# Patient Record
Sex: Female | Born: 1990 | Race: White | Hispanic: No | Smoking: Never smoker
Health system: Southern US, Community
[De-identification: ages and names within clinical notes are randomized; demographics above are authoritative.]

## PROBLEM LIST (undated history)

## (undated) DIAGNOSIS — R112 Nausea with vomiting, unspecified: Secondary | ICD-10-CM

## (undated) DIAGNOSIS — F419 Anxiety disorder, unspecified: Secondary | ICD-10-CM

## (undated) HISTORY — DX: Nausea with vomiting, unspecified: R11.2

## (undated) HISTORY — PX: OTHER SURGICAL HISTORY: SHX169

---

## 2015-06-09 ENCOUNTER — Ambulatory Visit: Payer: Self-pay | Admitting: Obstetrics and Gynecology

## 2015-06-15 ENCOUNTER — Ambulatory Visit: Payer: Self-pay | Admitting: Obstetrics and Gynecology

## 2015-11-25 ENCOUNTER — Emergency Department
Admission: EM | Admit: 2015-11-25 | Discharge: 2015-11-25 | Disposition: A | Discharge status: Home / Self Care | Payer: BLUE CROSS/BLUE SHIELD | Attending: Emergency Medicine | Admitting: Emergency Medicine

## 2015-11-25 ENCOUNTER — Encounter: Payer: Self-pay | Admitting: Emergency Medicine

## 2015-11-25 DIAGNOSIS — O210 Mild hyperemesis gravidarum: Secondary | ICD-10-CM | POA: Diagnosis not present

## 2015-11-25 DIAGNOSIS — Z3A01 Less than 8 weeks gestation of pregnancy: Secondary | ICD-10-CM | POA: Diagnosis not present

## 2015-11-25 DIAGNOSIS — R8299 Other abnormal findings in urine: Secondary | ICD-10-CM | POA: Insufficient documentation

## 2015-11-25 DIAGNOSIS — O219 Vomiting of pregnancy, unspecified: Secondary | ICD-10-CM | POA: Diagnosis present

## 2015-11-25 LAB — CBC WITH DIFFERENTIAL/PLATELET
BASOS ABS: 0 10*3/uL (ref 0–0.1)
BASOS PCT: 0 %
Eosinophils Absolute: 0 10*3/uL (ref 0–0.7)
Eosinophils Relative: 0 %
HEMATOCRIT: 38.3 % (ref 35.0–47.0)
Hemoglobin: 13.5 g/dL (ref 12.0–16.0)
Lymphocytes Relative: 8 %
Lymphs Abs: 1 10*3/uL (ref 1.0–3.6)
MCH: 31.5 pg (ref 26.0–34.0)
MCHC: 35.2 g/dL (ref 32.0–36.0)
MCV: 89.5 fL (ref 80.0–100.0)
MONO ABS: 0.4 10*3/uL (ref 0.2–0.9)
Monocytes Relative: 4 %
NEUTROS ABS: 11 10*3/uL — AB (ref 1.4–6.5)
NEUTROS PCT: 88 %
Platelets: 207 10*3/uL (ref 150–440)
RBC: 4.28 MIL/uL (ref 3.80–5.20)
RDW: 13.3 % (ref 11.5–14.5)
WBC: 12.4 10*3/uL — ABNORMAL HIGH (ref 3.6–11.0)

## 2015-11-25 LAB — COMPREHENSIVE METABOLIC PANEL
ALBUMIN: 4.4 g/dL (ref 3.5–5.0)
ALT: 14 U/L (ref 14–54)
ANION GAP: 8 (ref 5–15)
AST: 16 U/L (ref 15–41)
Alkaline Phosphatase: 61 U/L (ref 38–126)
BILIRUBIN TOTAL: 0.7 mg/dL (ref 0.3–1.2)
BUN: 7 mg/dL (ref 6–20)
CHLORIDE: 106 mmol/L (ref 101–111)
CO2: 22 mmol/L (ref 22–32)
Calcium: 9.1 mg/dL (ref 8.9–10.3)
Creatinine, Ser: 0.46 mg/dL (ref 0.44–1.00)
Glucose, Bld: 103 mg/dL — ABNORMAL HIGH (ref 65–99)
POTASSIUM: 3.9 mmol/L (ref 3.5–5.1)
Sodium: 136 mmol/L (ref 135–145)
TOTAL PROTEIN: 7.4 g/dL (ref 6.5–8.1)

## 2015-11-25 LAB — URINALYSIS COMPLETE WITH MICROSCOPIC (ARMC ONLY)
BILIRUBIN URINE: NEGATIVE
Glucose, UA: NEGATIVE mg/dL
HGB URINE DIPSTICK: NEGATIVE
LEUKOCYTES UA: NEGATIVE
NITRITE: NEGATIVE
PH: 7 (ref 5.0–8.0)
PROTEIN: NEGATIVE mg/dL
SPECIFIC GRAVITY, URINE: 1.001 — AB (ref 1.005–1.030)

## 2015-11-25 LAB — POCT PREGNANCY, URINE: Preg Test, Ur: POSITIVE — AB

## 2015-11-25 LAB — PREGNANCY, URINE: Preg Test, Ur: POSITIVE — AB

## 2015-11-25 LAB — LIPASE, BLOOD: LIPASE: 22 U/L (ref 11–51)

## 2015-11-25 MED ORDER — LACTATED RINGERS IV BOLUS (SEPSIS)
1000.0000 mL | Freq: Once | INTRAVENOUS | Status: AC
Start: 2015-11-25 — End: 2015-11-25
  Administered 2015-11-25: 1000 mL via INTRAVENOUS

## 2015-11-25 MED ORDER — FOLIC ACID 5 MG/ML IJ SOLN
1.0000 mg | Freq: Once | INTRAMUSCULAR | Status: AC
  Administered 2015-11-25: 1 mg via INTRAVENOUS
  Filled 2015-11-25: qty 0.2

## 2015-11-25 MED ORDER — LACTATED RINGERS IV SOLN
2000.0000 mL | Freq: Once | INTRAVENOUS | Status: AC
  Administered 2015-11-25: 2000 mL via INTRAVENOUS

## 2015-11-25 MED ORDER — ONDANSETRON HCL 4 MG/2ML IJ SOLN
4.0000 mg | Freq: Once | INTRAMUSCULAR | Status: AC
  Administered 2015-11-25: 4 mg via INTRAVENOUS
  Filled 2015-11-25: qty 2

## 2015-11-25 MED ORDER — DIPHENHYDRAMINE HCL 50 MG/ML IJ SOLN
12.5000 mg | Freq: Once | INTRAMUSCULAR | Status: AC
  Administered 2015-11-25: 12.5 mg via INTRAVENOUS
  Filled 2015-11-25: qty 1

## 2015-11-25 MED ORDER — DOXYLAMINE-PYRIDOXINE 10-10 MG PO TBEC: DELAYED_RELEASE_TABLET | ORAL | 1 refills | Status: DC

## 2015-11-25 NOTE — ED Notes (Signed)
Pt given water and applesauce and tolerating well.

## 2015-11-25 NOTE — ED Provider Notes (Signed)
[facility] Emergency Department Provider Note  ____________________________________________   I have reviewed the triage vital signs and the nursing notes.   HISTORY  Chief Complaint Morning Sickness    HPI Mikayla Morales is a 25 y.o. female  who is G2 P0 had a TAB this year because of hyperemesis, had a positive pregnancy test at home, last missed her period was towards the end of July making her just over [redacted] weeks pregnant. Patient's had no vaginal bleeding or cramping or spotting. She states however she has been vomiting off and on during the splinting over the last week has become worse just as it was during her prior pregnancy. She denies any hematemesis melena bright red blood per rectum she's having normal bowel movements he has occasional cramping discomfort in her upper abdomen which resolves after vomiting. She is not having any bilious vomiting. She states she feels dehydrated. She is not yet established care with OB.       History reviewed. No pertinent past medical history.  There are no active problems to display for this patient.   History reviewed. No pertinent surgical history.  Prior to Admission medications   Not on File    Allergies Review of patient's allergies indicates no known allergies.  No family history on file.  Social History Social History  Substance Use Topics  . Smoking status: Never Smoker  . Smokeless tobacco: Never Used  . Alcohol use No    Review of Systems Constitutional: No fever/chills Eyes: No visual changes. ENT: No sore throat. No stiff neck no neck pain Cardiovascular: Denies chest pain. Respiratory: Denies shortness of breath. Gastrointestinal:   + vomiting.  No diarrhea.  No constipation. Genitourinary: Negative for dysuria. Musculoskeletal: Negative lower extremity swelling Skin: Negative for rash. Neurological: Negative for severe headaches, focal weakness or numbness. 10-point ROS  otherwise negative.  ____________________________________________   PHYSICAL EXAM:  VITAL SIGNS: ED Triage Vitals [11/25/15 0835]  Enc Vitals Group     BP 104/62     Pulse Rate 91     Resp 20     Temp 98.2 F (36.8 C)     Temp Source Oral     SpO2 99 %     Weight 102 lb (46.3 kg)     Height 5\' 2"  (1.575 m)     Head Circumference      Peak Flow      Pain Score 5     Pain Loc      Pain Edu?      Excl. in GC?     Constitutional: Alert and oriented. Well appearing and in no acute distress. Eyes: Conjunctivae are normal. PERRL. EOMI. Head: Atraumatic. Nose: No congestion/rhinnorhea. Mouth/Throat: Mucous membranes are moist.  Oropharynx non-erythematous. Neck: No stridor.   Nontender with no meningismus Cardiovascular: Normal rate, regular rhythm. Grossly normal heart sounds.  Good peripheral circulation. Respiratory: Normal respiratory effort.  No retractions. Lungs CTAB. Abdominal: Soft and nontender. No distention. No guarding no rebound Back:  There is no focal tenderness or step off.  there is no midline tenderness there are no lesions noted. there is no CVA tenderness Musculoskeletal: No lower extremity tenderness, no upper extremity tenderness. No joint effusions, no DVT signs strong distal pulses no edema Neurologic:  Normal speech and language. No gross focal neurologic deficits are appreciated.  Skin:  Skin is warm, dry and intact. No rash noted. Psychiatric: Mood and affect are normal. Speech and behavior are normal.  ____________________________________________  LABS (all labs ordered are listed, but only abnormal results are displayed)  Labs Reviewed  CBC WITH DIFFERENTIAL/PLATELET - Abnormal; Notable for the following:       Result Value   WBC 12.4 (*)    Neutro Abs 11.0 (*)    All other components within normal limits  COMPREHENSIVE METABOLIC PANEL - Abnormal; Notable for the following:    Glucose, Bld 103 (*)    All other components within normal  limits  URINALYSIS COMPLETEWITH MICROSCOPIC ([facility] ONLY) - Abnormal; Notable for the following:    Color, Urine COLORLESS (*)    APPearance CLEAR (*)    Ketones, ur 1+ (*)    Specific Gravity, Urine 1.001 (*)    Bacteria, UA FEW (*)    Squamous Epithelial / LPF 0-5 (*)    All other components within normal limits  PREGNANCY, URINE - Abnormal; Notable for the following:    Preg Test, Ur POSITIVE (*)    All other components within normal limits  POCT PREGNANCY, URINE - Abnormal; Notable for the following:    Preg Test, Ur POSITIVE (*)    All other components within normal limits  URINE CULTURE  LIPASE, BLOOD  POC URINE PREG, ED   ____________________________________________  EKG  I personally interpreted any EKGs ordered by me or triage  ____________________________________________  RADIOLOGY  I reviewed any imaging ordered by me or triage that were performed during my shift and, if possible, patient and/or family made aware of any abnormal findings. ____________________________________________   PROCEDURES  Procedure(s) performed: None  Procedures  Critical Care performed: None  ____________________________________________   INITIAL IMPRESSION / ASSESSMENT AND PLAN / ED COURSE  Pertinent labs & imaging results that were available during my care of the patient were reviewed by me and considered in my medical decision making (see chart for details).  Patient with recurrent vomiting during pregnancy presents with vomiting during pregnancy. I did discuss with Dr. Chauncey CruelStabler, who does advise Zofran which we will give the patient. We will try to clean just at home. I'm giving her copious IV fluid. We will stress the need to take prenatal vitamins and no give her folate here. Patient with no lower abdominal discomfort no vaginal discharge and pain to suggest ectopic or PID, nothing to suggest appendicitis. We will continue to evaluate her. There is trace bacteria in her urine we  will send a urine culture that is no evidence of UTI.  Clinical Course   ____________________________________________   FINAL CLINICAL IMPRESSION(S) / ED DIAGNOSES  Final diagnoses:  None      This chart was dictated using voice recognition software.  Despite best efforts to proofread,  errors can occur which can change meaning.      Jeanmarie PlantJames A Awais Cobarrubias, MD 11/25/15 [phone]

## 2015-11-25 NOTE — ED Triage Notes (Signed)
Pt states she is app [redacted] weeks pregnant verified by home pregnancy test. Started vomiting last night with some abd pain. Pt states vomited at least 15 times. Pt with previous pregnancy had same sx but aborted the pregnancy.

## 2015-11-25 NOTE — ED Notes (Signed)
Pt ambulated to toilet no issues

## 2015-11-26 LAB — URINE CULTURE: CULTURE: NO GROWTH

## 2015-11-29 ENCOUNTER — Encounter: Payer: Self-pay | Admitting: Obstetrics and Gynecology

## 2015-11-29 ENCOUNTER — Ambulatory Visit (INDEPENDENT_AMBULATORY_CARE_PROVIDER_SITE_OTHER): Payer: BLUE CROSS/BLUE SHIELD | Admitting: Obstetrics and Gynecology

## 2015-11-29 VITALS — BP 97/65 | HR 98 | Ht 62.0 in | Wt 103.7 lb

## 2015-11-29 DIAGNOSIS — O219 Vomiting of pregnancy, unspecified: Secondary | ICD-10-CM | POA: Insufficient documentation

## 2015-11-29 DIAGNOSIS — R112 Nausea with vomiting, unspecified: Secondary | ICD-10-CM

## 2015-11-29 DIAGNOSIS — N912 Amenorrhea, unspecified: Secondary | ICD-10-CM | POA: Diagnosis not present

## 2015-11-29 DIAGNOSIS — Z3201 Encounter for pregnancy test, result positive: Secondary | ICD-10-CM | POA: Diagnosis not present

## 2015-11-29 NOTE — Progress Notes (Signed)
GYN ENCOUNTER NOTE  Subjective:       Mikayla Morales is a 25 y.o. G6P0010 female is here for gynecologic evaluation of the following issues:   1. Amenorrhea, with postive urine pregnancy test.  LMP: 10/05/15  EDD:07/11/16  EGA: 7 weeks  And 5 days  2. History of therapeutic abortion with D&C. Had no complication of infection.   3. Having nausea and vomiting that is worse at night. Seen by the ED on 11/25/15 and prescribed Diclegis for nausea. Has not been taking in fear of interaction with fetus.   4. Breast tenderness and mild cramping noted in the midline. No vaginal discharge or bleeding.  4.Takes a  [place] vitamin daily as she cannot get a pre-natal vitamin down.   Gynecologic History Patient's last menstrual period was 10/05/2015 (exact date). Contraception: condoms Last Pap: ? Last mammogram: n/a  Obstetric History OB History  Gravida Para Term Preterm AB Living  2       1    SAB TAB Ectopic Multiple Live Births    1          # Outcome Date GA Lbr Len/2nd Weight Sex Delivery Anes PTL Lv  2 Current           1 TAB 06/11/15              Past Medical History:  Diagnosis Date  . Nausea & vomiting   . Nausea and vomiting in pregnancy     Past Surgical History:  Procedure Laterality Date  . NO PAST SURGERIES      Current Outpatient Prescriptions on File Prior to Visit  Medication Sig Dispense Refill  . Doxylamine-Pyridoxine (DICLEGIS) 10-10 MG TBEC 2 tabs orally at hs (Day 1). If this works, continue taking 2 tab qhs. However, if nausea persists Day 2, take 2 tabs at bedtime that night then take three tablets starting on Day 3 (one tablet in the morning and two tablets at bedtime). If these 3 tablets control symptoms on Day 4, continue taking 3 tabs daily. Otherwise take 4 tabs starting on Day 4 (one tablet in the morning, one tablet mid-afternoon and two tablets at bedtime). 60 tablet 1   No current facility-administered medications on file prior to visit.      No Known Allergies  Social History   Social History  . Marital status: Single    Spouse name: N/A  . Number of children: N/A  . Years of education: N/A   Occupational History  . Not on file.   Social History Main Topics  . Smoking status: Never Smoker  . Smokeless tobacco: Never Used  . Alcohol use No  . Drug use:     Types: Marijuana     Comment: last used 1 week ago  . Sexual activity: Yes    Birth control/ protection: None   Other Topics Concern  . Not on file   Social History Narrative  . No narrative on file    Family History  Problem Relation Age of Onset  . Heart disease Father   . Breast cancer Maternal Grandmother   . Ovarian cancer Neg Hx   . Colon cancer Neg Hx   . Diabetes Neg Hx     The following portions of the patient's history were reviewed and updated as appropriate: allergies, current medications, past family history, past medical history, past social history, past surgical history and problem list.  Review of Systems Review of Systems - Negative except HPI Review  of Systems - General ROS: negative for - chills, fatigue, fever, hot flashes, malaise or night sweats Hematological and Lymphatic ROS: negative for - bleeding problems or swollen lymph nodes Gastrointestinal ROS: negative for - abdominal pain, blood in stools, change in bowel habits and nausea/vomiting Musculoskeletal ROS: negative for - joint pain, muscle pain or muscular weakness Genito-Urinary ROS: negative for - change in menstrual cycle, dysmenorrhea, dyspareunia, dysuria, genital discharge, genital ulcers, hematuria, incontinence, irregular/heavy menses, nocturia or pelvic pain  Objective:   BP 97/65   Pulse 98   Ht 5\' 2"  (1.575 m)   Wt 103 lb 11.2 oz (47 kg)   LMP 10/05/2015 (Exact Date)   BMI 18.97 kg/m  CONSTITUTIONAL: Well-developed, well-nourished female in no acute distress.  Physical exam deferred.     Assessment:   1. Amenorrhea  2. Positive urine  pregnancy test  3. Non-intractable vomiting with nausea, vomiting of unspecified type     Plan:  Educated patient on pre-natal care and plan. Advised that she take Diclegis as this will not harm her baby and help nausea. Recommended BRAT diet. Educated her on foods to avoid during pregnancy. 2 weeks-nursing OB intake 4 weeks- OB history and physical New OB counseling: The patient has been given an overview regarding routine prenatal care. Recommendations regarding diet, weight gain, and exercise in pregnancy were given. Prenatal testing, optional genetic testing, and ultrasound use in pregnancy were reviewed.  Benefits of Breast Feeding were discussed. The patient is encouraged to consider nursing her baby post partum.  A total of 30 minutes were spent face-to-face with the patient during the encounter with greater than 50% dealing with counseling and coordination of care.   Mikayla RidingJason Whitaker PA-S Mikayla HarmsMartin A Nicki Gracy, MD    I have seen, interviewed, and examined the patient in conjunction with the [employer] [facility] and affirm the diagnosis and management plan. Mikayla Vanblarcom A. Quentina Fronek, MD, FACOG   Note: This dictation was prepared with Dragon dictation along with smaller phrase technology. Any transcriptional errors that result from this process are unintentional.

## 2015-11-29 NOTE — Patient Instructions (Signed)
1. Take Diclegis everyday. 2 tablets at bedtime and 1 tablet twice a day during daytime 2. Return in 2 weeks for nursing OB intake appointment 3. Return in 4 weeks for new OB history and physical

## 2015-12-08 ENCOUNTER — Ambulatory Visit: Payer: Self-pay | Admitting: Obstetrics and Gynecology

## 2015-12-13 ENCOUNTER — Ambulatory Visit (INDEPENDENT_AMBULATORY_CARE_PROVIDER_SITE_OTHER): Payer: BLUE CROSS/BLUE SHIELD | Admitting: Obstetrics and Gynecology

## 2015-12-13 VITALS — BP 105/63 | HR 79 | Wt 105.0 lb

## 2015-12-13 DIAGNOSIS — Z1389 Encounter for screening for other disorder: Secondary | ICD-10-CM

## 2015-12-13 DIAGNOSIS — Z369 Encounter for antenatal screening, unspecified: Secondary | ICD-10-CM

## 2015-12-13 DIAGNOSIS — Z331 Pregnant state, incidental: Secondary | ICD-10-CM

## 2015-12-13 DIAGNOSIS — Z3481 Encounter for supervision of other normal pregnancy, first trimester: Secondary | ICD-10-CM

## 2015-12-13 DIAGNOSIS — Z113 Encounter for screening for infections with a predominantly sexual mode of transmission: Secondary | ICD-10-CM

## 2015-12-13 MED ORDER — ONDANSETRON 4 MG PO TBDP: 4.0000 mg | ORAL_TABLET | Freq: Three times a day (TID) | ORAL | 1 refills | Status: DC | PRN

## 2015-12-13 NOTE — Patient Instructions (Signed)
Pregnancy and Zika Virus Disease Zika virus disease, or Zika, is an illness that can spread to people from mosquitoes that carry the virus. It may also spread from person to person through infected body fluids. Zika first occurred in [place], but recently it has spread to new areas. The virus occurs in tropical climates. The location of Zika continues to change. Most people who become infected with Zika virus do not develop serious illness. However, Zika may cause birth defects in an unborn baby whose mother is infected with the virus. It may also increase the risk of miscarriage. WHAT ARE THE SYMPTOMS OF ZIKA VIRUS DISEASE? In many cases, people who have been infected with Zika virus do not develop any symptoms. If symptoms appear, they usually start about a week after the person is infected. Symptoms are usually mild. They may include:  Fever.  Rash.  Red eyes.  Joint pain. HOW DOES ZIKA VIRUS DISEASE SPREAD? The main way that Zika virus spreads is through the bite of a certain type of mosquito. Unlike most types of mosquitos, which bite only at night, the type of mosquito that carries Zika virus bites both at night and during the day. Zika virus can also spread through sexual contact, through a blood transfusion, and from a mother to her baby before or during birth. Once you have had Zika virus disease, it is unlikely that you will get it again. CAN I PASS ZIKA TO MY BABY DURING PREGNANCY? Yes, Zika can pass from a mother to her baby before or during birth. WHAT PROBLEMS CAN ZIKA CAUSE FOR MY BABY? A woman who is infected with Zika virus while pregnant is at risk of having her baby born with a condition in which the brain or head is smaller than expected (microcephaly). Babies who have microcephaly can have developmental delays, seizures, hearing problems, and vision problems. Having Zika virus disease during pregnancy can also increase the risk of miscarriage. HOW CAN ZIKA VIRUS DISEASE BE  PREVENTED? There is no vaccine to prevent Zika. The best way to prevent the disease is to avoid infected mosquitoes and avoid exposure to body fluids that can spread the virus. Avoid any possible exposure to Zika by taking the following precautions. For women and their sex partners:  Avoid traveling to high-risk areas. The locations where Zika is being reported change often. To identify high-risk areas, check the CDC travel website: www.cdc.gov/zika/geo/index.html  If you or your sex partner must travel to a high-risk area, talk with a health care provider before and after traveling.  Take all precautions to avoid mosquito bites if you live in, or travel to, any of the high-risk areas. Insect repellents are safe to use during pregnancy.  Ask your health care provider when it is safe to have sexual contact. For women:  If you are pregnant or trying to become pregnant, avoid sexual contact with persons who may have been exposed to Zika virus, persons who have possible symptoms of Zika, or persons whose history you are unsure about. If you choose to have sexual contact with someone who may have been exposed to Zika virus, use condoms correctly during the entire duration of sexual activity, every time. Do not share sexual devices, as you may be exposed to body fluids.  Ask your health care provider about when it is safe to attempt pregnancy after a possible exposure to Zika virus. WHAT STEPS SHOULD I TAKE TO AVOID MOSQUITO BITES? Take these steps to avoid mosquito bites when you are   in a high-risk area:  Wear loose clothing that covers your arms and legs.  Limit your outdoor activities.  Do not open windows unless they have window screens.  Sleep under mosquito nets.  Use insect repellent. The best insect repellents have:  DEET, picaridin, oil of lemon eucalyptus (OLE), or IR3535 in them.  Higher amounts of an active ingredient in them.  Remember that insect repellents are safe to use  during pregnancy.  Do not use OLE on children who are younger than 3 years of age. Do not use insect repellent on babies who are younger than 2 months of age.  Cover your child's stroller with mosquito netting. Make sure the netting fits snugly and that any loose netting does not cover your child's mouth or nose. Do not use a blanket as a mosquito-protection cover.  Do not apply insect repellent underneath clothing.  If you are using sunscreen, apply the sunscreen before applying the insect repellent.  Treat clothing with permethrin. Do not apply permethrin directly to your skin. Follow label directions for safe use.  Get rid of standing water, where mosquitoes may reproduce. Standing water is often found in items such as buckets, bowls, animal food dishes, and flowerpots. When you return from traveling to any high-risk area, continue taking actions to protect yourself against mosquito bites for 3 weeks, even if you show no signs of illness. This will prevent spreading Zika virus to uninfected mosquitoes. WHAT SHOULD I KNOW ABOUT THE SEXUAL TRANSMISSION OF ZIKA? People can spread Zika to their sexual partners during vaginal, anal, or oral sex, or by sharing sexual devices. Many people with Zika do not develop symptoms, so a person could spread the disease without knowing that they are infected. The greatest risk is to women who are pregnant or who may become pregnant. Zika virus can live longer in semen than it can live in blood. Couples can prevent sexual transmission of the virus by:  Using condoms correctly during the entire duration of sexual activity, every time. This includes vaginal, anal, and oral sex.  Not sharing sexual devices. Sharing increases your risk of being exposed to body fluid from another person.  Avoiding all sexual activity until your health care provider says it is safe. SHOULD I BE TESTED FOR ZIKA VIRUS? A sample of your blood can be tested for Zika virus. A pregnant  woman should be tested if she may have been exposed to the virus or if she has symptoms of Zika. She may also have additional tests done during her pregnancy, such ultrasound testing. Talk with your health care provider about which tests are recommended.   This information is not intended to replace advice given to you by your health care provider. Make sure you discuss any questions you have with your health care provider.   Document Released: 11/17/2014 Document Reviewed: 11/10/2014 Elsevier Interactive Patient Education 2016 [employer] Minor Illnesses and Medications in Pregnancy  Cold/Flu:  Sudafed for congestion- Robitussin (plain) for cough- Tylenol for discomfort.  Please follow the directions on the label.  Try not to take any more than needed.  OTC Saline nasal spray and air humidifier or cool-mist  Vaporizer to sooth nasal irritation and to loosen congestion.  It is also important to increase intake of non carbonated fluids, especially if you have a fever.  Constipation:  Colace-2 capsules at bedtime; Metamucil- follow directions on label; Senokot- 1 tablet at bedtime.  Any one of these medications can be used.  It is also   very important to increase fluids and fruits along with regular exercise.  If problem persists please call the office.  Diarrhea:  Kaopectate as directed on the label.  Eat a bland diet and increase fluids.  Avoid highly seasoned foods.  Headache:  Tylenol 1 or 2 tablets every 3-4 hours as needed  Indigestion:  Maalox, Mylanta, Tums or Rolaids- as directed on label.  Also try to eat small meals and avoid fatty, greasy or spicy foods.  Nausea with or without Vomiting:  Nausea in pregnancy is caused by increased levels of hormones in the body which influence the digestive system and cause irritation when stomach acids accumulate.  Symptoms usually subside after 1st trimester of pregnancy.  Try the following: 1. Keep saltines, graham crackers or dry toast by your bed  to eat upon awakening. 2. Don't let your stomach get empty.  Try to eat 5-6 small meals per day instead of 3 large ones. 3. Avoid greasy fatty or highly seasoned foods.  4. Take OTC Unisom 1 tablet at bed time along with OTC Vitamin B6 25-50 mg 3 times per day.    If nausea continues with vomiting and you are unable to keep down food and fluids you may need a prescription medication.  Please notify your provider.   Sore throat:  Chloraseptic spray, throat lozenges and or plain Tylenol.  Vaginal Yeast Infection:  OTC Monistat for 7 days as directed on label.  If symptoms do not resolve within a week notify provider.  If any of the above problems do not subside with recommended treatment please call the office for further assistance.   Do not take Aspirin, Advil, Motrin or Ibuprofen.  * * OTC= Over the counter Hyperemesis Gravidarum Hyperemesis gravidarum is a severe form of nausea and vomiting that happens during pregnancy. Hyperemesis is worse than morning sickness. It may cause you to have nausea or vomiting all day for many days. It may keep you from eating and drinking enough food and liquids. Hyperemesis usually occurs during the first half (the first 20 weeks) of pregnancy. It often goes away once a woman is in her second half of pregnancy. However, sometimes hyperemesis continues through an entire pregnancy.  CAUSES  The cause of this condition is not completely known but is thought to be related to changes in the body's hormones when pregnant. It could be from the high level of the pregnancy hormone or an increase in estrogen in the body.  SIGNS AND SYMPTOMS   Severe nausea and vomiting.  Nausea that does not go away.  Vomiting that does not allow you to keep any food down.  Weight loss and body fluid loss (dehydration).  Having no desire to eat or not liking food you have previously enjoyed. DIAGNOSIS  Your health care provider will do a physical exam and ask you about your  symptoms. He or she may also order blood tests and urine tests to make sure something else is not causing the problem.  TREATMENT  You may only need medicine to control the problem. If medicines do not control the nausea and vomiting, you will be treated in the hospital to prevent dehydration, increased acid in the blood (acidosis), weight loss, and changes in the electrolytes in your body that may harm the unborn baby (fetus). You may need IV fluids.  HOME CARE INSTRUCTIONS   Only take over-the-counter or prescription medicines as directed by your health care provider.  Try eating a couple of dry crackers or   toast in the morning before getting out of bed.  Avoid foods and smells that upset your stomach.  Avoid fatty and spicy foods.  Eat 5-6 small meals a day.  Do not drink when eating meals. Drink between meals.  For snacks, eat high-protein foods, such as cheese.  Eat or suck on things that have ginger in them. Ginger helps nausea.  Avoid food preparation. The smell of food can spoil your appetite.  Avoid iron pills and iron in your multivitamins until after 3-4 months of being pregnant. However, consult with your health care provider before stopping any prescribed iron pills. SEEK MEDICAL CARE IF:   Your abdominal pain increases.  You have a severe headache.  You have vision problems.  You are losing weight. SEEK IMMEDIATE MEDICAL CARE IF:   You are unable to keep fluids down.  You vomit blood.  You have constant nausea and vomiting.  You have excessive weakness.  You have extreme thirst.  You have dizziness or fainting.  You have a fever or persistent symptoms for more than 2-3 days.  You have a fever and your symptoms suddenly get worse. MAKE SURE YOU:   Understand these instructions.  Will watch your condition.  Will get help right away if you are not doing well or get worse.   This information is not intended to replace advice given to you by your  health care provider. Make sure you discuss any questions you have with your health care provider.   Document Released: 02/26/2005 Document Revised: 12/17/2012 Document Reviewed: 10/08/2012 Elsevier Interactive Patient Education 2016 [employer] Commonly Asked Questions During Pregnancy  Cats: A parasite can be excreted in cat feces.  To avoid exposure you need to have another person empty the little box.  If you must empty the litter box you will need to wear gloves.  Wash your hands after handling your cat.  This parasite can also be found in raw or undercooked meat so this should also be avoided.  Colds, Sore Throats, Flu: Please check your medication sheet to see what you can take for symptoms.  If your symptoms are unrelieved by these medications please call the office.  Dental Work: Most any dental work your dentist recommends is permitted.  X-rays should only be taken during the first trimester if absolutely necessary.  Your abdomen should be shielded with a lead apron during all x-rays.  Please notify your provider prior to receiving any x-rays.  Novocaine is fine; gas is not recommended.  If your dentist requires a note from us prior to dental work please call the office and we will provide one for you.  Exercise: Exercise is an important part of staying healthy during your pregnancy.  You may continue most exercises you were accustomed to prior to pregnancy.  Later in your pregnancy you will most likely notice you have difficulty with activities requiring balance like riding a bicycle.  It is important that you listen to your body and avoid activities that put you at a higher risk of falling.  Adequate rest and staying well hydrated are a must!  If you have questions about the safety of specific activities ask your provider.    Exposure to Children with illness: Try to avoid obvious exposure; report any symptoms to us when noted,  If you have chicken pos, red measles or mumps, you should  be immune to these diseases.   Please do not take any vaccines while pregnant unless you have checked with   your OB provider.  Fetal Movement: After 28 weeks we recommend you do "kick counts" twice daily.  Lie or sit down in a calm quiet environment and count your baby movements "kicks".  You should feel your baby at least 10 times per hour.  If you have not felt 10 kicks within the first hour get up, walk around and have something sweet to eat or drink then repeat for an additional hour.  If count remains less than 10 per hour notify your provider.  Fumigating: Follow your pest control agent's advice as to how long to stay out of your home.  Ventilate the area well before re-entering.  Hemorrhoids:   Most over-the-counter preparations can be used during pregnancy.  Check your medication to see what is safe to use.  It is important to use a stool softener or fiber in your diet and to drink lots of liquids.  If hemorrhoids seem to be getting worse please call the office.   Hot Tubs:  Hot tubs Jacuzzis and saunas are not recommended while pregnant.  These increase your internal body temperature and should be avoided.  Intercourse:  Sexual intercourse is safe during pregnancy as long as you are comfortable, unless otherwise advised by your provider.  Spotting may occur after intercourse; report any bright red bleeding that is heavier than spotting.  Labor:  If you know that you are in labor, please go to the hospital.  If you are unsure, please call the office and let us help you decide what to do.  Lifting, straining, etc:  If your job requires heavy lifting or straining please check with your provider for any limitations.  Generally, you should not lift items heavier than that you can lift simply with your hands and arms (no back muscles)  Painting:  Paint fumes do not harm your pregnancy, but may make you ill and should be avoided if possible.  Latex or water based paints have less odor than oils.   Use adequate ventilation while painting.  Permanents & Hair Color:  Chemicals in hair dyes are not recommended as they cause increase hair dryness which can increase hair loss during pregnancy.  " Highlighting" and permanents are allowed.  Dye may be absorbed differently and permanents may not hold as well during pregnancy.  Sunbathing:  Use a sunscreen, as skin burns easily during pregnancy.  Drink plenty of fluids; avoid over heating.  Tanning Beds:  Because their possible side effects are still unknown, tanning beds are not recommended.  Ultrasound Scans:  Routine ultrasounds are performed at approximately 20 weeks.  You will be able to see your baby's general anatomy an if you would like to know the gender this can usually be determined as well.  If it is questionable when you conceived you may also receive an ultrasound early in your pregnancy for dating purposes.  Otherwise ultrasound exams are not routinely performed unless there is a medical necessity.  Although you can request a scan we ask that you pay for it when conducted because insurance does not cover " patient request" scans.  Work: If your pregnancy proceeds without complications you may work until your due date, unless your physician or employer advises otherwise.  Round Ligament Pain/Pelvic Discomfort:  Sharp, shooting pains not associated with bleeding are fairly common, usually occurring in the second trimester of pregnancy.  They tend to be worse when standing up or when you remain standing for long periods of time.  These are the result   of pressure of certain pelvic ligaments called "round ligaments".  Rest, Tylenol and heat seem to be the most effective relief.  As the womb and fetus grow, they rise out of the pelvis and the discomfort improves.  Please notify the office if your pain seems different than that described.  It may represent a more serious condition.   

## 2015-12-13 NOTE — Progress Notes (Signed)
Mikayla GulaIsabelle Morales presents for NOB nurse interview visit. Pregnancy confirmation done on 11/29/2015 by Dr. Greggory KeeneFrancesco.  G-2.  P-[id number] . Pregnancy education material explained and given. No cats in the home. NOB labs ordered. HIV labs and Drug screen were explained optional and she did not decline. Drug screen ordered. PNV encouraged. 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 Genetic screening to discuss with provider. Pt. To follow up with provider on 12/29/2015 as previously scheduled  for NOB physical.  All questions answered.

## 2015-12-14 LAB — HIV ANTIBODY (ROUTINE TESTING W REFLEX): HIV Screen 4th Generation wRfx: NONREACTIVE

## 2015-12-14 LAB — RH TYPE: RH TYPE: POSITIVE

## 2015-12-14 LAB — CBC WITH DIFFERENTIAL/PLATELET
BASOS: 0 %
Basophils Absolute: 0 10*3/uL (ref 0.0–0.2)
EOS (ABSOLUTE): 0.1 10*3/uL (ref 0.0–0.4)
Eos: 1 %
Hematocrit: 38.1 % (ref 34.0–46.6)
Hemoglobin: 12.9 g/dL (ref 11.1–15.9)
IMMATURE GRANS (ABS): 0 10*3/uL (ref 0.0–0.1)
IMMATURE GRANULOCYTES: 0 %
LYMPHS: 16 %
Lymphocytes Absolute: 2 10*3/uL (ref 0.7–3.1)
MCH: 30.4 pg (ref 26.6–33.0)
MCHC: 33.9 g/dL (ref 31.5–35.7)
MCV: 90 fL (ref 79–97)
Monocytes Absolute: 0.8 10*3/uL (ref 0.1–0.9)
Monocytes: 6 %
NEUTROS PCT: 77 %
Neutrophils Absolute: 9.7 10*3/uL — ABNORMAL HIGH (ref 1.4–7.0)
PLATELETS: 228 10*3/uL (ref 150–379)
RBC: 4.24 x10E6/uL (ref 3.77–5.28)
RDW: 14.1 % (ref 12.3–15.4)
WBC: 12.7 10*3/uL — ABNORMAL HIGH (ref 3.4–10.8)

## 2015-12-14 LAB — HEPATITIS B SURFACE ANTIGEN: HEP B S AG: NEGATIVE

## 2015-12-14 LAB — RUBELLA SCREEN: Rubella Antibodies, IGG: 1.31 index (ref >0.99)

## 2015-12-14 LAB — ABO

## 2015-12-14 LAB — RPR: RPR Ser Ql: NONREACTIVE

## 2015-12-14 LAB — VARICELLA ZOSTER ANTIBODY, IGG: Varicella zoster IgG: 385 index (ref >165)

## 2015-12-14 LAB — ANTIBODY SCREEN: Antibody Screen: NEGATIVE

## 2015-12-15 LAB — GC/CHLAMYDIA PROBE AMP
Chlamydia trachomatis, NAA: NEGATIVE
Neisseria gonorrhoeae by PCR: NEGATIVE

## 2015-12-15 LAB — CULTURE, OB URINE

## 2015-12-15 LAB — URINE CULTURE, OB REFLEX: ORGANISM ID, BACTERIA: NO GROWTH

## 2015-12-19 LAB — URINALYSIS, ROUTINE W REFLEX MICROSCOPIC
BILIRUBIN UA: NEGATIVE
Glucose, UA: NEGATIVE
KETONES UA: NEGATIVE
LEUKOCYTES UA: NEGATIVE
Nitrite, UA: NEGATIVE
PH UA: 7 (ref 5.0–7.5)
PROTEIN UA: NEGATIVE
RBC UA: NEGATIVE
Specific Gravity, UA: 1.009 (ref 1.005–1.030)
Urobilinogen, Ur: 0.2 mg/dL (ref 0.2–1.0)

## 2015-12-19 LAB — MONITOR DRUG PROFILE 14(MW)
AMPHETAMINE SCREEN URINE: NEGATIVE ng/mL
BARBITURATE SCREEN URINE: NEGATIVE ng/mL
BENZODIAZEPINE SCREEN, URINE: NEGATIVE ng/mL
Buprenorphine, Urine: NEGATIVE ng/mL
Cocaine (Metab) Scrn, Ur: NEGATIVE ng/mL
Creatinine(Crt), U: 32.8 mg/dL (ref 20.0–300.0)
Fentanyl, Urine: NEGATIVE pg/mL
Meperidine Screen, Urine: NEGATIVE ng/mL
Methadone Screen, Urine: NEGATIVE ng/mL
OXYCODONE+OXYMORPHONE UR QL SCN: NEGATIVE ng/mL
Opiate Scrn, Ur: NEGATIVE ng/mL
PH UR, DRUG SCRN: 6.9 (ref 4.5–8.9)
PROPOXYPHENE SCREEN URINE: NEGATIVE ng/mL
Phencyclidine Qn, Ur: NEGATIVE ng/mL
SPECIFIC GRAVITY: 1.009
Tramadol Screen, Urine: NEGATIVE ng/mL

## 2015-12-19 LAB — CANNABINOID (GC/MS), URINE
CANNABINOID UR: POSITIVE — AB
Carboxy THC (GC/MS): >300 ng/mL

## 2015-12-19 LAB — NICOTINE SCREEN, URINE: Cotinine Ql Scrn, Ur: NEGATIVE ng/mL

## 2015-12-20 ENCOUNTER — Encounter: Payer: Self-pay | Admitting: Obstetrics and Gynecology

## 2015-12-20 DIAGNOSIS — R825 Elevated urine levels of drugs, medicaments and biological substances: Secondary | ICD-10-CM | POA: Insufficient documentation

## 2015-12-29 ENCOUNTER — Ambulatory Visit (INDEPENDENT_AMBULATORY_CARE_PROVIDER_SITE_OTHER): Payer: BLUE CROSS/BLUE SHIELD | Admitting: Obstetrics and Gynecology

## 2015-12-29 ENCOUNTER — Encounter: Payer: Self-pay | Admitting: Obstetrics and Gynecology

## 2015-12-29 VITALS — BP 126/82 | HR 100 | Wt 102.5 lb

## 2015-12-29 DIAGNOSIS — O09299 Supervision of pregnancy with other poor reproductive or obstetric history, unspecified trimester: Secondary | ICD-10-CM

## 2015-12-29 DIAGNOSIS — Z8759 Personal history of other complications of pregnancy, childbirth and the puerperium: Secondary | ICD-10-CM | POA: Insufficient documentation

## 2015-12-29 DIAGNOSIS — Z124 Encounter for screening for malignant neoplasm of cervix: Secondary | ICD-10-CM

## 2015-12-29 DIAGNOSIS — O219 Vomiting of pregnancy, unspecified: Secondary | ICD-10-CM

## 2015-12-29 DIAGNOSIS — Z3482 Encounter for supervision of other normal pregnancy, second trimester: Secondary | ICD-10-CM

## 2015-12-29 DIAGNOSIS — R825 Elevated urine levels of drugs, medicaments and biological substances: Secondary | ICD-10-CM

## 2015-12-29 LAB — POCT URINALYSIS DIPSTICK
BILIRUBIN UA: NEGATIVE
Glucose, UA: NEGATIVE
Ketones, UA: NEGATIVE
NITRITE UA: NEGATIVE
PH UA: 7.5
Protein, UA: NEGATIVE
SPEC GRAV UA: 1.015
UROBILINOGEN UA: NEGATIVE

## 2015-12-29 NOTE — Patient Instructions (Addendum)
Morning Sickness °Morning sickness is when you feel sick to your stomach (nauseous) during pregnancy. This nauseous feeling may or may not come with vomiting. It often occurs in the morning but can be a problem any time of day. Morning sickness is most common during the first trimester, but it may continue throughout pregnancy. While morning sickness is unpleasant, it is usually harmless unless you develop severe and continual vomiting (hyperemesis gravidarum). This condition requires more intense treatment.  °CAUSES  °The cause of morning sickness is not completely known but seems to be related to normal hormonal changes that occur in pregnancy. °RISK FACTORS °You are at greater risk if you: °· Experienced nausea or vomiting before your pregnancy. °· Had morning sickness during a previous pregnancy. °· Are pregnant with more than one baby, such as twins. °TREATMENT  °Do not use any medicines (prescription, over-the-counter, or herbal) for morning sickness without first talking to your health care provider. Your health care provider may prescribe or recommend: °· Vitamin B6 supplements. °· Anti-nausea medicines. °· The herbal medicine ginger. °HOME CARE INSTRUCTIONS  °· Only take over-the-counter or prescription medicines as directed by your health care provider. °· Taking multivitamins before getting pregnant can prevent or decrease the severity of morning sickness in most women. °· Eat a piece of dry toast or unsalted crackers before getting out of bed in the morning. °· Eat five or six small meals a day. °· Eat dry and bland foods (rice, baked potato). Foods high in carbohydrates are often helpful. °· Do not drink liquids with your meals. Drink liquids between meals. °· Avoid greasy, fatty, and spicy foods. °· Get someone to cook for you if the smell of any food causes nausea and vomiting. °· If you feel nauseous after taking prenatal vitamins, take the vitamins at night or with a snack.  °· Snack on protein  foods (nuts, yogurt, cheese) between meals if you are hungry. °· Eat unsweetened gelatins for desserts. °· Wearing an acupressure wristband (worn for sea sickness) may be helpful. °· Acupuncture may be helpful. °· Do not smoke. °· Get a humidifier to keep the air in your house free of odors. °· Get plenty of fresh air. °SEEK MEDICAL CARE IF:  °· Your home remedies are not working, and you need medicine. °· You feel dizzy or lightheaded. °· You are losing weight. °SEEK IMMEDIATE MEDICAL CARE IF:  °· You have persistent and uncontrolled nausea and vomiting. °· You pass out (faint). °MAKE SURE YOU: °· Understand these instructions. °· Will watch your condition. °· Will get help right away if you are not doing well or get worse. °  °This information is not intended to replace advice given to you by your health care provider. Make sure you discuss any questions you have with your health care provider. °  °Document Released: 04/19/2006 Document Revised: 03/03/2013 Document Reviewed: 08/13/2012 °Elsevier Interactive Patient Education ©2016 [employer] °First Trimester of Pregnancy °The first trimester of pregnancy is from week 1 until the end of week 12 (months 1 through 3). A week after a sperm fertilizes an egg, the egg will implant on the wall of the uterus. This embryo will begin to develop into a baby. Genes from you and your partner are forming the baby. The female genes determine whether the baby is a boy or a girl. At 6-8 weeks, the eyes and face are formed, and the heartbeat can be seen on ultrasound. At the end of 12 weeks, all the baby's organs   are formed.  °Now that you are pregnant, you will want to do everything you can to have a healthy baby. Two of the most important things are to get good prenatal care and to follow your health care provider's instructions. Prenatal care is all the medical care you receive before the baby's birth. This care will help prevent, find, and treat any problems during the  pregnancy and childbirth. °BODY CHANGES °Your body goes through many changes during pregnancy. The changes vary from woman to woman.  °· You may gain or lose a couple of pounds at first. °· You may feel sick to your stomach (nauseous) and throw up (vomit). If the vomiting is uncontrollable, call your health care provider. °· You may tire easily. °· You may develop headaches that can be relieved by medicines approved by your health care provider. °· You may urinate more often. Painful urination may mean you have a bladder infection. °· You may develop heartburn as a result of your pregnancy. °· You may develop constipation because certain hormones are causing the muscles that push waste through your intestines to slow down. °· You may develop hemorrhoids or swollen, bulging veins (varicose veins). °· Your breasts may begin to grow larger and become tender. Your nipples may stick out more, and the tissue that surrounds them (areola) may become darker. °· Your gums may bleed and may be sensitive to brushing and flossing. °· Dark spots or blotches (chloasma, mask of pregnancy) may develop on your face. This will likely fade after the baby is born. °· Your menstrual periods will stop. °· You may have a loss of appetite. °· You may develop cravings for certain kinds of food. °· You may have changes in your emotions from day to day, such as being excited to be pregnant or being concerned that something may go wrong with the pregnancy and baby. °· You may have more vivid and strange dreams. °· You may have changes in your hair. These can include thickening of your hair, rapid growth, and changes in texture. Some women also have hair loss during or after pregnancy, or hair that feels dry or thin. Your hair will most likely return to normal after your baby is born. °WHAT TO EXPECT AT YOUR PRENATAL VISITS °During a routine prenatal visit: °· You will be weighed to make sure you and the baby are growing normally. °· Your blood  pressure will be taken. °· Your abdomen will be measured to track your baby's growth. °· The fetal heartbeat will be listened to starting around week 10 or 12 of your pregnancy. °· Test results from any previous visits will be discussed. °Your health care provider may ask you: °· How you are feeling. °· If you are feeling the baby move. °· If you have had any abnormal symptoms, such as leaking fluid, bleeding, severe headaches, or abdominal cramping. °· If you are using any tobacco products, including cigarettes, chewing tobacco, and electronic cigarettes. °· If you have any questions. °Other tests that may be performed during your first trimester include: °· Blood tests to find your blood type and to check for the presence of any previous infections. They will also be used to check for low iron levels (anemia) and Rh antibodies. Later in the pregnancy, blood tests for diabetes will be done along with other tests if problems develop. °· Urine tests to check for infections, diabetes, or protein in the urine. °· An ultrasound to confirm the proper growth and development of   the baby. °· An amniocentesis to check for possible genetic problems. °· Fetal screens for spina bifida and Down syndrome. °· You may need other tests to make sure you and the baby are doing well. °· HIV (human immunodeficiency virus) testing. Routine prenatal testing includes screening for HIV, unless you choose not to have this test. °HOME CARE INSTRUCTIONS  °Medicines °· Follow your health care provider's instructions regarding medicine use. Specific medicines may be either safe or unsafe to take during pregnancy. °· Take your prenatal vitamins as directed. °· If you develop constipation, try taking a stool softener if your health care provider approves. °Diet °· Eat regular, well-balanced meals. Choose a variety of foods, such as meat or vegetable-based protein, fish, milk and low-fat dairy products, vegetables, fruits, and whole grain breads  and cereals. Your health care provider will help you determine the amount of weight gain that is right for you. °· Avoid raw meat and uncooked cheese. These carry germs that can cause birth defects in the baby. °· Eating four or five small meals rather than three large meals a day may help relieve nausea and vomiting. If you start to feel nauseous, eating a few soda crackers can be helpful. Drinking liquids between meals instead of during meals also seems to help nausea and vomiting. °· If you develop constipation, eat more high-fiber foods, such as fresh vegetables or fruit and whole grains. Drink enough fluids to keep your urine clear or pale yellow. °Activity and Exercise °· Exercise only as directed by your health care provider. Exercising will help you: °¨ Control your weight. °¨ Stay in shape. °¨ Be prepared for labor and delivery. °· Experiencing pain or cramping in the lower abdomen or low back is a good sign that you should stop exercising. Check with your health care provider before continuing normal exercises. °· Try to avoid standing for long periods of time. Move your legs often if you must stand in one place for a long time. °· Avoid heavy lifting. °· Wear low-heeled shoes, and practice good posture. °· You may continue to have sex unless your health care provider directs you otherwise. °Relief of Pain or Discomfort °· Wear a good support bra for breast tenderness.   °· Take warm sitz baths to soothe any pain or discomfort caused by hemorrhoids. Use hemorrhoid cream if your health care provider approves.   °· Rest with your legs elevated if you have leg cramps or low back pain. °· If you develop varicose veins in your legs, wear support hose. Elevate your feet for 15 minutes, 3-4 times a day. Limit salt in your diet. °Prenatal Care °· Schedule your prenatal visits by the twelfth week of pregnancy. They are usually scheduled monthly at first, then more often in the last 2 months before  delivery. °· Write down your questions. Take them to your prenatal visits. °· Keep all your prenatal visits as directed by your health care provider. °Safety °· Wear your seat belt at all times when driving. °· Make a list of emergency phone numbers, including numbers for family, friends, the hospital, and police and fire departments. °General Tips °· Ask your health care provider for a referral to a local prenatal education class. Begin classes no later than at the beginning of month 6 of your pregnancy. °· Ask for help if you have counseling or nutritional needs during pregnancy. Your health care provider can offer advice or refer you to specialists for help with various needs. °· Do not use   hot tubs, steam rooms, or saunas. °· Do not douche or use tampons or scented sanitary pads. °· Do not cross your legs for long periods of time. °· Avoid cat litter boxes and soil used by cats. These carry germs that can cause birth defects in the baby and possibly loss of the fetus by miscarriage or stillbirth. °· Avoid all smoking, herbs, alcohol, and medicines not prescribed by your health care provider. Chemicals in these affect the formation and growth of the baby. °· Do not use any tobacco products, including cigarettes, chewing tobacco, and electronic cigarettes. If you need help quitting, ask your health care provider. You may receive counseling support and other resources to help you quit. °· Schedule a dentist appointment. At home, brush your teeth with a soft toothbrush and be gentle when you floss. °SEEK MEDICAL CARE IF:  °· You have dizziness. °· You have mild pelvic cramps, pelvic pressure, or nagging pain in the abdominal area. °· You have persistent nausea, vomiting, or diarrhea. °· You have a bad smelling vaginal discharge. °· You have pain with urination. °· You notice increased swelling in your face, hands, legs, or ankles. °SEEK IMMEDIATE MEDICAL CARE IF:  °· You have a fever. °· You are leaking fluid from  your vagina. °· You have spotting or bleeding from your vagina. °· You have severe abdominal cramping or pain. °· You have rapid weight gain or loss. °· You vomit blood or material that looks like coffee grounds. °· You are exposed to German measles and have never had them. °· You are exposed to fifth disease or chickenpox. °· You develop a severe headache. °· You have shortness of breath. °· You have any kind of trauma, such as from a fall or a car accident. °  °This information is not intended to replace advice given to you by your health care provider. Make sure you discuss any questions you have with your health care provider. °  °Document Released: 02/20/2001 Document Revised: 03/19/2014 Document Reviewed: 01/06/2013 °Elsevier Interactive Patient Education ©2016 [employer] Inc. ° °

## 2015-12-29 NOTE — Progress Notes (Signed)
OBSTETRIC INITIAL PRENATAL VISIT  Subjective:    Mikayla Morales is being seen today for her first obstetrical visit.  This is not a planned pregnancy. She is a 25 y.o. G2P0010 female at [redacted]w[redacted]d gestation, Estimated Date of Delivery: 07/11/16 with Patient's last menstrual period was 10/05/2015 (exact date) Her obstetrical history is significant for previous marijuana use (reports cessation 2 weeks ago, was using for nausea/vomiting), and h/o miscarriage. Relationship with FOB: significant other, not living together. Patient does intend to breast feed. Pregnancy history fully reviewed.    Obstetric History   G2   P0   T0   P0   A1   L0    SAB1   TAB0   Ectopic0   Multiple0   Live Births0     # Outcome Date GA Lbr Len/2nd Weight Sex Delivery Anes PTL Lv  2 Current           1 TAB 06/11/15 [redacted]w[redacted]d             Gynecologic History:  Last pap smear was ~ 4 years ago per pt.  Results were normal.  Denies  h/o abnormal pap smears in the past.  Denies history of STIs.    Past Medical History:  Diagnosis Date  . Nausea & vomiting   . Nausea and vomiting in pregnancy     Family History  Problem Relation Age of Onset  . Heart disease Father   . Breast cancer Maternal Grandmother   . Ovarian cancer Neg Hx   . Colon cancer Neg Hx   . Diabetes Neg Hx     Past Surgical History:  Procedure Laterality Date  . broke nose      Social History   Social History  . Marital status: Single    Spouse name: N/A  . Number of children: N/A  . Years of education: N/A   Occupational History  . serves food    Social History Main Topics  . Smoking status: Never Smoker  . Smokeless tobacco: Never Used  . Alcohol use No  . Drug use:     Types: Marijuana     Comment: last used 1 week ago  . Sexual activity: Yes    Birth control/ protection: None   Other Topics Concern  . Not on file   Social History Narrative  . No narrative on file    Current Outpatient Prescriptions on File Prior to  Visit  Medication Sig Dispense Refill  . Doxylamine-Pyridoxine (DICLEGIS) 10-10 MG TBEC 2 tabs orally at hs (Day 1). If this works, continue taking 2 tab qhs. However, if nausea persists Day 2, take 2 tabs at bedtime that night then take three tablets starting on Day 3 (one tablet in the morning and two tablets at bedtime). If these 3 tablets control symptoms on Day 4, continue taking 3 tabs daily. Otherwise take 4 tabs starting on Day 4 (one tablet in the morning, one tablet mid-afternoon and two tablets at bedtime). 60 tablet 1  . Pediatric Multiple Vit-C-FA (FLINSTONES GUMMIES OMEGA-3 DHA PO) Take by mouth.    . ondansetron (ZOFRAN-ODT) 4 MG disintegrating tablet Take 1 tablet (4 mg total) by mouth every 8 (eight) hours as needed for nausea or vomiting. (Patient not taking: Reported on 12/29/2015) 20 tablet 1   No current facility-administered medications on file prior to visit.     No Known Allergies   Review of Systems General:Not Present- Fever, Weight Loss and Weight Gain. Skin:Not Present- Rash.  HEENT:Not Present- Blurred Vision, Headache and Bleeding Gums. Respiratory:Not Present- Difficulty Breathing. Breast:Not Present- Breast Mass. Cardiovascular:Not Present- Chest Pain, Elevated Blood Pressure, Fainting / Blacking Out and Shortness of Breath. Gastrointestinal:Present - Nausea/Vomiting (taking Diclegis but not helping). Not Present- Abdominal Pain, Constipation. Female Genitourinary:Not Present- Frequency, Painful Urination, Pelvic Pain, Vaginal Bleeding, Vaginal Discharge, Contractions, regular, Fetal Movements Decreased, Urinary Complaints and Vaginal Fluid. Musculoskeletal:Not Present- Back Pain and Leg Cramps. Neurological:Not Present- Dizziness. Psychiatric:Not Present- Depression.     Objective:   Blood pressure 126/82, pulse 100, weight 102 lb 8 oz (46.5 kg), last menstrual period 10/05/2015.  Body mass index is 18.75 kg/m.  General Appearance:    Alert,  cooperative, no distress, appears stated age  Head:    Normocephalic, without obvious abnormality, atraumatic  Eyes:    PERRL, conjunctiva/corneas clear, EOM's intact, both eyes  Ears:    Normal external ear canals, both ears  Nose:   Nares normal, septum midline, mucosa normal, no drainage or sinus tenderness  Throat:   Lips, mucosa, and tongue normal; teeth and gums normal  Neck:   Supple, symmetrical, trachea midline, no adenopathy; thyroid: no enlargement/tenderness/nodules; no carotid bruit or JVD  Back:     Symmetric, no curvature, ROM normal, no CVA tenderness  Lungs:     Clear to auscultation bilaterally, respirations unlabored  Chest Wall:    No tenderness or deformity   Heart:    Regular rate and rhythm, S1 and S2 normal, no murmur, rub or gallop  Breast Exam:    No tenderness, masses, or nipple abnormality  Abdomen:     Soft, non-tender, bowel sounds active all four quadrants, no masses, no organomegaly.  FH 12.  FHT 157 bpm (by ultrasound, unable to appreciate on Dopplers).  Genitalia:    Pelvic:external genitalia normal, vagina without lesions, discharge, or tenderness, rectovaginal septum  normal. Cervix normal in appearance, no cervical motion tenderness, no adnexal masses or tenderness.  Pregnancy positive findings: uterine enlargement: 12 wk size, nontender.   Rectal:    Normal external sphincter.  No hemorrhoids appreciated. Internal exam not done.   Extremities:   Extremities normal, atraumatic, no cyanosis or edema  Pulses:   2+ and symmetric all extremities  Skin:   Skin color, texture, turgor normal, no rashes or lesions  Lymph nodes:   Cervical, supraclavicular, and axillary nodes normal  Neurologic:   CNII-XII intact, normal strength, sensation and reflexes throughout       Assessment:    Pregnancy at 12 and 1/7 weeks   H/o marijuana use, in remission H/o miscarriage Nausea/vomiting of pregnancy   Plan:   - Initial labs reviewed.  - Pap smear performed  today. - Patient with +UDS for cannabinoids.  Patient notes cessation 2 weeks ago.  - Prenatal vitamins encouraged. - Problem list reviewed and updated. - Nausea/vomiting of pregnancy, not controlled with Diclegis.  Patient declines use of stronger prescription anti-emetcis.  Discussed other natural management options, as well as BRAT diet, smaller portions.  Notes that it has improved some during the day but still bad at night.  Also encouraged use of Pedialyte or popsicles to maintain hydration.  - New OB counseling:  The patient has been given an overview regarding routine prenatal care.  Recommendations regarding diet, weight gain, and exercise in pregnancy were given. - Prenatal testing, optional genetic testing, and ultrasound use in pregnancy were reviewed.  AFP3 discussed: desired but concerned regarding cost with insurance.  Will have patient discuss with billing representative.Marland Kitchen -  Benefits of Breast Feeding were discussed. The patient is encouraged to consider nursing her baby post partum. Follow up in 4 weeks.  50% of 30 min visit spent on counseling and coordination of care.     Hildred LaserAnika Nicholas Ossa, MD [facility]

## 2016-01-04 LAB — PAP IG, CT-NG, RFX HPV ASCU
Chlamydia, Nuc. Acid Amp: NEGATIVE
Gonococcus by Nucleic Acid Amp: NEGATIVE
PAP Smear Comment: 0

## 2016-01-26 ENCOUNTER — Ambulatory Visit (INDEPENDENT_AMBULATORY_CARE_PROVIDER_SITE_OTHER): Payer: BLUE CROSS/BLUE SHIELD | Admitting: Obstetrics and Gynecology

## 2016-01-26 VITALS — BP 98/61 | HR 89 | Wt 107.1 lb

## 2016-01-26 DIAGNOSIS — R0989 Other specified symptoms and signs involving the circulatory and respiratory systems: Secondary | ICD-10-CM

## 2016-01-26 DIAGNOSIS — N949 Unspecified condition associated with female genital organs and menstrual cycle: Secondary | ICD-10-CM

## 2016-01-26 DIAGNOSIS — G479 Sleep disorder, unspecified: Secondary | ICD-10-CM

## 2016-01-26 DIAGNOSIS — O219 Vomiting of pregnancy, unspecified: Secondary | ICD-10-CM

## 2016-01-26 DIAGNOSIS — Z3482 Encounter for supervision of other normal pregnancy, second trimester: Secondary | ICD-10-CM

## 2016-01-26 LAB — POCT URINALYSIS DIPSTICK
Bilirubin, UA: NEGATIVE
Glucose, UA: NEGATIVE
KETONES UA: NEGATIVE
LEUKOCYTES UA: NEGATIVE
Nitrite, UA: NEGATIVE
PH UA: 7.5
PROTEIN UA: NEGATIVE
Spec Grav, UA: <=1.005
Urobilinogen, UA: NEGATIVE

## 2016-01-26 NOTE — Progress Notes (Signed)
ROB: Patient notes complaints of round ligament pain and cramping. No bleeding.  Also with mild URI symptoms starting yesterday, discussed safe meds in pregnancy.  Still having some mild nausea/vomiting at night but otherwise ok.  Declines meds. Is using sea bands. Also noting some sleep disturbances, only able to sleep 3-4 hrs at a time.  Discussed aromatherapy, calming teas, Unisom. RTC in 4 weeks, for anatomy scan next visit.

## 2016-01-26 NOTE — Progress Notes (Deleted)
ROB

## 2016-01-31 ENCOUNTER — Encounter: Payer: Self-pay | Admitting: Obstetrics and Gynecology

## 2016-02-29 ENCOUNTER — Ambulatory Visit (INDEPENDENT_AMBULATORY_CARE_PROVIDER_SITE_OTHER): Payer: BLUE CROSS/BLUE SHIELD

## 2016-02-29 ENCOUNTER — Ambulatory Visit (INDEPENDENT_AMBULATORY_CARE_PROVIDER_SITE_OTHER): Payer: BLUE CROSS/BLUE SHIELD | Admitting: Obstetrics and Gynecology

## 2016-02-29 VITALS — BP 126/74 | HR 103 | Wt 115.0 lb

## 2016-02-29 DIAGNOSIS — Z3482 Encounter for supervision of other normal pregnancy, second trimester: Secondary | ICD-10-CM | POA: Diagnosis not present

## 2016-02-29 LAB — POCT URINALYSIS DIPSTICK
Bilirubin, UA: NEGATIVE
GLUCOSE UA: NEGATIVE
Ketones, UA: NEGATIVE
Leukocytes, UA: NEGATIVE
Nitrite, UA: NEGATIVE
Protein, UA: NEGATIVE
UROBILINOGEN UA: NEGATIVE
pH, UA: 7

## 2016-02-29 NOTE — Progress Notes (Signed)
ROB: Doing well. S/p normal anatomy scan.  RTC in 4 weeks.

## 2016-03-12 NOTE — L&D Delivery Note (Signed)
      Delivery Note   Mikayla Morales is a 26 y.o. G2P1011 at [redacted]w[redacted]d Estimated Date of Delivery: 07/11/16  PRE-OPERATIVE DIAGNOSIS:  1) [redacted]w[redacted]d pregnancy.   POST-OPERATIVE DIAGNOSIS:  1) [redacted]w[redacted]d pregnancy s/p Vaginal, Spontaneous Delivery   Delivery Type: Vaginal, Spontaneous Delivery    Delivery Clinician: Linzie Collin   Delivery Anesthesia: Epidural   Labor Complications:     Additional complications: leg cord  ESTIMATED BLOOD LOSS: 125  ml    FINDINGS:   1) female infant, Apgar scores of 8    at 1 minute and 9    at 5 minutes and a birthweight of 113.58  ounces.    2) Nuchal cord: No  SPECIMENS:   PLACENTA:   Appearance: Intact    Removal: Expressed      DISPOSITION:  Infant to left in stable condition in the delivery room, with L&D personnel and mother,  NARRATIVE SUMMARY: Labor course:  [name] is a G2P1011 at [redacted]w[redacted]d who presented for SROM.  She progressed well in labor with pitocin.  She received the appropriate anesthesia and proceeded to complete dilation. She evidenced good maternal expulsive effort during the second stage. She went on to deliver a viable infant. The placenta delivered without problems and was noted to be complete. A perineal and vaginal examination was performed. Episiotomy/Lacerations: 2nd degree;Perineal  Episiotomy or lacerations were repaired with Vicryl suture using local anesthesia. The patient tolerated this well.  Mikayla Morales, M.D. 07/10/2016 8:42 AM

## 2016-03-26 ENCOUNTER — Encounter: Payer: BLUE CROSS/BLUE SHIELD | Admitting: Certified Nurse Midwife

## 2016-03-30 ENCOUNTER — Ambulatory Visit (INDEPENDENT_AMBULATORY_CARE_PROVIDER_SITE_OTHER): Payer: BLUE CROSS/BLUE SHIELD | Admitting: Certified Nurse Midwife

## 2016-03-30 VITALS — BP 100/64 | HR 80 | Wt 122.3 lb

## 2016-03-30 DIAGNOSIS — Z3482 Encounter for supervision of other normal pregnancy, second trimester: Secondary | ICD-10-CM

## 2016-03-30 LAB — POCT URINALYSIS DIPSTICK
Bilirubin, UA: NEGATIVE
Glucose, UA: NEGATIVE
Ketones, UA: NEGATIVE
Leukocytes, UA: NEGATIVE
NITRITE UA: NEGATIVE
PH UA: 7.5
PROTEIN UA: NEGATIVE
UROBILINOGEN UA: NEGATIVE

## 2016-03-30 NOTE — Patient Instructions (Signed)
Round Ligament Pain Introduction The round ligament is a cord of muscle and tissue that helps to support the uterus. It can become a source of pain during pregnancy if it becomes stretched or twisted as the baby grows. The pain usually begins in the second trimester of pregnancy, and it can come and go until the baby is delivered. It is not a serious problem, and it does not cause harm to the baby. Round ligament pain is usually a short, sharp, and pinching pain, but it can also be a dull, lingering, and aching pain. The pain is felt in the lower side of the abdomen or in the groin. It usually starts deep in the groin and moves up to the outside of the hip area. Pain can occur with:  A sudden change in position.  Rolling over in bed.  Coughing or sneezing.  Physical activity. Follow these instructions at home: Watch your condition for any changes. Take these steps to help with your pain:  When the pain starts, relax. Then try:  Sitting down.  Flexing your knees up to your abdomen.  Lying on your side with one pillow under your abdomen and another pillow between your legs.  Sitting in a warm bath for 15-20 minutes or until the pain goes away.  Take over-the-counter and prescription medicines only as told by your health care provider.  Move slowly when you sit and stand.  Avoid long walks if they cause pain.  Stop or lessen your physical activities if they cause pain. Contact a health care provider if:  Your pain does not go away with treatment.  You feel pain in your back that you did not have before.  Your medicine is not helping. Get help right away if:  You develop a fever or chills.  You develop uterine contractions.  You develop vaginal bleeding.  You develop nausea or vomiting.  You develop diarrhea.  You have pain when you urinate. This information is not intended to replace advice given to you by your health care provider. Make sure you discuss any questions  you have with your health care provider. Document Released: 12/06/2007 Document Revised: 08/04/2015 Document Reviewed: 05/05/2014  2017 Elsevier  

## 2016-03-30 NOTE — Progress Notes (Signed)
ROB-Pt doing well. Reports nasal congestion, history of nose bleed three weeks ago, and round ligament pain. Discussed increased congestion common in pregnancy and use of OTC measures. Round ligament pain handouts given. Reviewed red flag symptoms and when to call. RTC x 3 weeks for glucola and ROB.

## 2016-04-24 ENCOUNTER — Other Ambulatory Visit: Payer: BLUE CROSS/BLUE SHIELD

## 2016-04-24 ENCOUNTER — Ambulatory Visit (INDEPENDENT_AMBULATORY_CARE_PROVIDER_SITE_OTHER): Payer: BLUE CROSS/BLUE SHIELD | Admitting: Obstetrics and Gynecology

## 2016-04-24 VITALS — BP 108/68 | HR 98 | Wt 132.2 lb

## 2016-04-24 DIAGNOSIS — Z3482 Encounter for supervision of other normal pregnancy, second trimester: Secondary | ICD-10-CM

## 2016-04-24 DIAGNOSIS — Z3483 Encounter for supervision of other normal pregnancy, third trimester: Secondary | ICD-10-CM

## 2016-04-24 LAB — POCT URINALYSIS DIPSTICK
Bilirubin, UA: NEGATIVE
GLUCOSE UA: NEGATIVE
KETONES UA: NEGATIVE
Leukocytes, UA: NEGATIVE
Nitrite, UA: NEGATIVE
Protein, UA: NEGATIVE
Urobilinogen, UA: NEGATIVE
pH, UA: 7.5

## 2016-04-24 NOTE — Progress Notes (Signed)
ROB: Patient c/o LUQ pain, feels sore.  Possibly due to fetal positioning.  Discussed different positioning techniques. For 28 week labs today.  Desires to breastfeed,  Desires unsure method for contraception (considering non-hormonal options, condoms vs ParaGard). Desires to postpone Tdap today, considering if she will get it.  Signed blood consent, discussed cord blood banking.  Desires delayed cord clamping of 20 minutes or longer. RTC in 2 weeks.

## 2016-04-24 NOTE — Patient Instructions (Addendum)
Third Trimester of Pregnancy The third trimester is from week 29 through week 40 (months 7 through 9). The third trimester is a time when the unborn baby (fetus) is growing rapidly. At the end of the ninth month, the fetus is about 20 inches in length and weighs 6-10 pounds. Body changes during your third trimester Your body goes through many changes during pregnancy. The changes vary from woman to woman. During the third trimester:  Your weight will continue to increase. You can expect to gain 25-35 pounds (11-16 kg) by the end of the pregnancy.  You may begin to get stretch marks on your hips, abdomen, and breasts.  You may urinate more often because the fetus is moving lower into your pelvis and pressing on your bladder.  You may develop or continue to have heartburn. This is caused by increased hormones that slow down muscles in the digestive tract.  You may develop or continue to have constipation because increased hormones slow digestion and cause the muscles that push waste through your intestines to relax.  You may develop hemorrhoids. These are swollen veins (varicose veins) in the rectum that can itch or be painful.  You may develop swollen, bulging veins (varicose veins) in your legs.  You may have increased body aches in the pelvis, back, or thighs. This is due to weight gain and increased hormones that are relaxing your joints.  You may have changes in your hair. These can include thickening of your hair, rapid growth, and changes in texture. Some women also have hair loss during or after pregnancy, or hair that feels dry or thin. Your hair will most likely return to normal after your baby is born.  Your breasts will continue to grow and they will continue to become tender. A yellow fluid (colostrum) may leak from your breasts. This is the first milk you are producing for your baby.  Your belly button may stick out.  You may notice more swelling in your hands, face, or  ankles.  You may have increased tingling or numbness in your hands, arms, and legs. The skin on your belly may also feel numb.  You may feel short of breath because of your expanding uterus.  You may have more problems sleeping. This can be caused by the size of your belly, increased need to urinate, and an increase in your body's metabolism.  You may notice the fetus "dropping," or moving lower in your abdomen.  You may have increased vaginal discharge.  Your cervix becomes thin and soft (effaced) near your due date. What to expect at prenatal visits You will have prenatal exams every 2 weeks until week 36. Then you will have weekly prenatal exams. During a routine prenatal visit:  You will be weighed to make sure you and the fetus are growing normally.  Your blood pressure will be taken.  Your abdomen will be measured to track your baby's growth.  The fetal heartbeat will be listened to.  Any test results from the previous visit will be discussed.  You may have a cervical check near your due date to see if you have effaced. At around 36 weeks, your health care provider will check your cervix. At the same time, your health care provider will also perform a test on the secretions of the vaginal tissue. This test is to determine if a type of bacteria, Group B streptococcus, is present. Your health care provider will explain this further. Your health care provider may ask you:    What your birth plan is.  How you are feeling.  If you are feeling the baby move.  If you have had any abnormal symptoms, such as leaking fluid, bleeding, severe headaches, or abdominal cramping.  If you are using any tobacco products, including cigarettes, chewing tobacco, and electronic cigarettes.  If you have any questions. Other tests or screenings that may be performed during your third trimester include:  Blood tests that check for low iron levels (anemia).  Fetal testing to check the health,  activity level, and growth of the fetus. Testing is done if you have certain medical conditions or if there are problems during the pregnancy.  Nonstress test (NST). This test checks the health of your baby to make sure there are no signs of problems, such as the baby not getting enough oxygen. During this test, a belt is placed around your belly. The baby is made to move, and its heart rate is monitored during movement. What is false labor? False labor is a condition in which you feel small, irregular tightenings of the muscles in the womb (contractions) that eventually go away. These are called Braxton Hicks contractions. Contractions may last for hours, days, or even weeks before true labor sets in. If contractions come at regular intervals, become more frequent, increase in intensity, or become painful, you should see your health care provider. What are the signs of labor?  Abdominal cramps.  Regular contractions that start at 10 minutes apart and become stronger and more frequent with time.  Contractions that start on the top of the uterus and spread down to the lower abdomen and back.  Increased pelvic pressure and dull back pain.  A watery or bloody mucus discharge that comes from the vagina.  Leaking of amniotic fluid. This is also known as your "water breaking." It could be a slow trickle or a gush. Let your doctor know if it has a color or strange odor. If you have any of these signs, call your health care provider right away, even if it is before your due date. Follow these instructions at home: Eating and drinking  Continue to eat regular, healthy meals.  Do not eat:  Raw meat or meat spreads.  Unpasteurized milk or cheese.  Unpasteurized juice.  Store-made salad.  Refrigerated smoked seafood.  Hot dogs or deli meat, unless they are piping hot.  More than 6 ounces of albacore tuna a week.  Shark, swordfish, king mackerel, or tile fish.  Store-made salads.  Raw  sprouts, such as mung bean or alfalfa sprouts.  Take prenatal vitamins as told by your health care provider.  Take 1000 mg of calcium daily as told by your health care provider.  If you develop constipation:  Take over-the-counter or prescription medicines.  Drink enough fluid to keep your urine clear or pale yellow.  Eat foods that are high in fiber, such as fresh fruits and vegetables, whole grains, and beans.  Limit foods that are high in fat and processed sugars, such as fried and sweet foods. Activity  Exercise only as directed by your health care provider. Healthy pregnant women should aim for 2 hours and 30 minutes of moderate exercise per week. If you experience any pain or discomfort while exercising, stop.  Avoid heavy lifting.  Do not exercise in extreme heat or humidity, or at high altitudes.  Wear low-heel, comfortable shoes.  Practice good posture.  Do not travel far distances unless it is absolutely necessary and only with the approval   of your health care provider.  Wear your seat belt at all times while in a car, on a bus, or on a plane.  Take frequent breaks and rest with your legs elevated if you have leg cramps or low back pain.  Do not use hot tubs, steam rooms, or saunas.  You may continue to have sex unless your health care provider tells you otherwise. Lifestyle  Do not use any products that contain nicotine or tobacco, such as cigarettes and e-cigarettes. If you need help quitting, ask your health care provider.  Do not drink alcohol.  Do not use any medicinal herbs or unprescribed drugs. These chemicals affect the formation and growth of the baby.  If you develop varicose veins:  Wear support pantyhose or compression stockings as told by your healthcare provider.  Elevate your feet for 15 minutes, 3-4 times a day.  Wear a supportive maternity bra to help with breast tenderness. General instructions  Take over-the-counter and prescription  medicines only as told by your health care provider. There are medicines that are either safe or unsafe to take during pregnancy.  Take warm sitz baths to soothe any pain or discomfort caused by hemorrhoids. Use hemorrhoid cream or witch hazel if your health care provider approves.  Avoid cat litter boxes and soil used by cats. These carry germs that can cause birth defects in the baby. If you have a cat, ask someone to clean the litter box for you.  To prepare for the arrival of your baby:  Take prenatal classes to understand, practice, and ask questions about the labor and delivery.  Make a trial run to the hospital.  Visit the hospital and tour the maternity area.  Arrange for maternity or paternity leave through employers.  Arrange for family and friends to take care of pets while you are in the hospital.  Purchase a rear-facing car seat and make sure you know how to install it in your car.  Pack your hospital bag.  Prepare the baby's nursery. Make sure to remove all pillows and stuffed animals from the baby's crib to prevent suffocation.  Visit your dentist if you have not gone during your pregnancy. Use a soft toothbrush to brush your teeth and be gentle when you floss.  Keep all prenatal follow-up visits as told by your health care provider. This is important. Contact a health care provider if:  You are unsure if you are in labor or if your water has broken.  You become dizzy.  You have mild pelvic cramps, pelvic pressure, or nagging pain in your abdominal area.  You have lower back pain.  You have persistent nausea, vomiting, or diarrhea.  You have an unusual or bad smelling vaginal discharge.  You have pain when you urinate. Get help right away if:  You have a fever.  You are leaking fluid from your vagina.  You have spotting or bleeding from your vagina.  You have severe abdominal pain or cramping.  You have rapid weight loss or weight gain.  You have  shortness of breath with chest pain.  You notice sudden or extreme swelling of your face, hands, ankles, feet, or legs.  Your baby makes fewer than 10 movements in 2 hours.  You have severe headaches that do not go away with medicine.  You have vision changes. Summary  The third trimester is from week 29 through week 40, months 7 through 9. The third trimester is a time when the unborn baby (fetus)   is growing rapidly.  During the third trimester, your discomfort may increase as you and your baby continue to gain weight. You may have abdominal, leg, and back pain, sleeping problems, and an increased need to urinate.  During the third trimester your breasts will keep growing and they will continue to become tender. A yellow fluid (colostrum) may leak from your breasts. This is the first milk you are producing for your baby.  False labor is a condition in which you feel small, irregular tightenings of the muscles in the womb (contractions) that eventually go away. These are called Braxton Hicks contractions. Contractions may last for hours, days, or even weeks before true labor sets in.  Signs of labor can include: abdominal cramps; regular contractions that start at 10 minutes apart and become stronger and more frequent with time; watery or bloody mucus discharge that comes from the vagina; increased pelvic pressure and dull back pain; and leaking of amniotic fluid. This information is not intended to replace advice given to you by your health care provider. Make sure you discuss any questions you have with your health care provider. Document Released: 02/20/2001 Document Revised: 08/04/2015 Document Reviewed: 04/29/2012 Elsevier Interactive Patient Education  2017 [employer]  [facility] Introduction Before your baby arrives it is important to:  Have all of the supplies that you will need to care for your baby.  Know where to go if there is an emergency.  Discuss the  baby's arrival with other family members. What supplies will I need?   It is recommended that you have the following supplies: Large Items  Crib.  Crib mattress.  Rear-facing infant car seat. If possible, have a trained professional check to make sure that it is installed correctly. Feeding  6-8 bottles that are 4-5 oz in size.  6-8 nipples.  Bottle brush.  Sterilizer, or a large pan or kettle with a lid.  A way to boil and cool water.  If you will be breastfeeding:  Breast pump.  Nipple cream.  Nursing bra.  Breast pads.  Breast shields.  If you will be formula feeding:  Formula.  Measuring cups.  Measuring spoons. Bathing  Mild baby soap and baby shampoo.  Petroleum jelly.  Soft cloth towel and washcloth.  Hooded towel.  Cotton balls.  Bath basin. Other Supplies  Rectal thermometer.  Bulb syringe.  Baby wipes or washcloths for diaper changes.  Diaper bag.  Changing pad.  Clothing, including one-piece outfits and pajamas.  Baby nail clippers.  Receiving blankets.  Mattress pad and sheets for the crib.  Night-light for the baby's room.  Baby monitor.  2 or 3 pacifiers.  Either 24-36 cloth diapers and waterproof diaper covers or a box of disposable diapers. You may need to use as many as 10-12 diapers per day. How do I prepare for an emergency? Prepare for an emergency by:  Knowing how to get to the nearest hospital.  Listing the phone numbers of your baby's health care providers near your home phone and in your cell phone. How do I prepare my family?  Decide how to handle visitors.  If you have other children:  Talk with them about the baby coming home. Ask them how they feel about it.  Read a book together about being a new big brother or sister.  Find ways to let them help you prepare for the new baby.  Have someone ready to care for them while you are in the hospital. This  information is not intended to replace  advice given to you by your health care provider. Make sure you discuss any questions you have with your health care provider. Document Released: 02/09/2008 Document Revised: 08/04/2015 Document Reviewed: 02/03/2014  2017 Elsevier

## 2016-04-25 LAB — HEMOGLOBIN AND HEMATOCRIT, BLOOD
HEMATOCRIT: 39 % (ref 34.0–46.6)
Hemoglobin: 12.8 g/dL (ref 11.1–15.9)

## 2016-04-25 LAB — GLUCOSE TOLERANCE, 1 HOUR: Glucose, 1Hr PP: 71 mg/dL (ref 65–199)

## 2016-05-08 ENCOUNTER — Encounter: Payer: BLUE CROSS/BLUE SHIELD | Admitting: Obstetrics and Gynecology

## 2016-05-09 ENCOUNTER — Ambulatory Visit (INDEPENDENT_AMBULATORY_CARE_PROVIDER_SITE_OTHER): Payer: BLUE CROSS/BLUE SHIELD | Admitting: Obstetrics and Gynecology

## 2016-05-09 VITALS — BP 99/66 | HR 92 | Wt 135.4 lb

## 2016-05-09 DIAGNOSIS — Z3483 Encounter for supervision of other normal pregnancy, third trimester: Secondary | ICD-10-CM

## 2016-05-09 LAB — POCT URINALYSIS DIPSTICK
Bilirubin, UA: NEGATIVE
Glucose, UA: NEGATIVE
KETONES UA: NEGATIVE
Nitrite, UA: NEGATIVE
PH UA: 8.5
PROTEIN UA: NEGATIVE
Spec Grav, UA: 1.01
Urobilinogen, UA: NEGATIVE

## 2016-05-09 NOTE — Progress Notes (Signed)
ROB: ROB: Patient notes mild cough and sore throat.  Advised on throat lozenges, tea with honey and/or lemon, and gargling with salt water for sore throat, robitussin for cough.   All questions answered about pain management in labor, fetal kick counts discussed, signs of labor discussed. RTC in 2 weeks.

## 2016-05-23 ENCOUNTER — Encounter: Payer: Self-pay | Admitting: Obstetrics and Gynecology

## 2016-05-23 ENCOUNTER — Ambulatory Visit (INDEPENDENT_AMBULATORY_CARE_PROVIDER_SITE_OTHER): Payer: BLUE CROSS/BLUE SHIELD | Admitting: Obstetrics and Gynecology

## 2016-05-23 VITALS — BP 101/55 | HR 82 | Wt 138.4 lb

## 2016-05-23 DIAGNOSIS — O26843 Uterine size-date discrepancy, third trimester: Secondary | ICD-10-CM

## 2016-05-23 DIAGNOSIS — Z3493 Encounter for supervision of normal pregnancy, unspecified, third trimester: Secondary | ICD-10-CM

## 2016-05-23 LAB — POCT URINALYSIS DIPSTICK
Glucose, UA: NEGATIVE
PROTEIN UA: NEGATIVE

## 2016-05-23 NOTE — Progress Notes (Signed)
ROB:  Should without complaint today. Reports active fetal movement. Declined Tdap.  Size less than dates noted we will get ultrasound with next visit.

## 2016-06-06 ENCOUNTER — Ambulatory Visit (INDEPENDENT_AMBULATORY_CARE_PROVIDER_SITE_OTHER): Payer: BLUE CROSS/BLUE SHIELD | Admitting: Obstetrics and Gynecology

## 2016-06-06 ENCOUNTER — Encounter: Payer: Self-pay | Admitting: Obstetrics and Gynecology

## 2016-06-06 ENCOUNTER — Ambulatory Visit (INDEPENDENT_AMBULATORY_CARE_PROVIDER_SITE_OTHER): Payer: BLUE CROSS/BLUE SHIELD

## 2016-06-06 VITALS — BP 109/72 | HR 91 | Wt 143.2 lb

## 2016-06-06 DIAGNOSIS — Z3493 Encounter for supervision of normal pregnancy, unspecified, third trimester: Secondary | ICD-10-CM

## 2016-06-06 DIAGNOSIS — O26843 Uterine size-date discrepancy, third trimester: Secondary | ICD-10-CM

## 2016-06-06 LAB — POCT URINALYSIS DIPSTICK
BILIRUBIN UA: NEGATIVE
Blood, UA: NEGATIVE
GLUCOSE UA: NEGATIVE
KETONES UA: NEGATIVE
LEUKOCYTES UA: NEGATIVE
Nitrite, UA: NEGATIVE
Protein, UA: NEGATIVE
Spec Grav, UA: 1.005 (ref 1.030–1.035)
Urobilinogen, UA: NEGATIVE (ref ?–2.0)
pH, UA: 7.5 (ref 5.0–8.0)

## 2016-06-06 NOTE — Addendum Note (Signed)
Addended by: Brooke DareSICK, Kimmberly Wisser L on: 06/06/2016 11:08 AM   Modules accepted: Orders

## 2016-06-06 NOTE — Progress Notes (Signed)
We discussed her recent ultrasound. Baby appropriately sized. GC/CT/GBS performed today.  Signs and symptoms of labor discussed.

## 2016-06-08 LAB — GC/CHLAMYDIA PROBE AMP
Chlamydia trachomatis, NAA: NEGATIVE
Neisseria gonorrhoeae by PCR: NEGATIVE

## 2016-06-08 LAB — STREP GP B NAA: Strep Gp B NAA: NEGATIVE

## 2016-06-20 ENCOUNTER — Encounter: Payer: Self-pay | Admitting: Obstetrics and Gynecology

## 2016-06-20 ENCOUNTER — Ambulatory Visit (INDEPENDENT_AMBULATORY_CARE_PROVIDER_SITE_OTHER): Payer: BLUE CROSS/BLUE SHIELD | Admitting: Obstetrics and Gynecology

## 2016-06-20 VITALS — BP 108/72 | HR 78 | Wt 145.9 lb

## 2016-06-20 DIAGNOSIS — Z3483 Encounter for supervision of other normal pregnancy, third trimester: Secondary | ICD-10-CM

## 2016-06-20 LAB — POCT URINALYSIS DIPSTICK
Bilirubin, UA: NEGATIVE
Glucose, UA: NEGATIVE
KETONES UA: NEGATIVE
LEUKOCYTES UA: NEGATIVE — AB
Nitrite, UA: NEGATIVE
PH UA: 8.5 — AB (ref 5.0–8.0)
PROTEIN UA: NEGATIVE
RBC UA: NEGATIVE
SPEC GRAV UA: 1.01 (ref 1.010–1.025)
Urobilinogen, UA: NEGATIVE E.U./dL — AB

## 2016-06-20 NOTE — Progress Notes (Signed)
ROB: Patient c/o low back pain, pelvic cramping, and vaginal sharp pains. Denies bleeding.  Discussed Tylenol, warm baths, strecthing, birthing ball. Discussed labor precautions. 36 week labs negative. RTC in 1 week.

## 2016-06-27 ENCOUNTER — Ambulatory Visit (INDEPENDENT_AMBULATORY_CARE_PROVIDER_SITE_OTHER): Payer: BLUE CROSS/BLUE SHIELD | Admitting: Obstetrics and Gynecology

## 2016-06-27 ENCOUNTER — Encounter: Payer: Self-pay | Admitting: Obstetrics and Gynecology

## 2016-06-27 VITALS — BP 121/79 | HR 98 | Wt 150.3 lb

## 2016-06-27 DIAGNOSIS — Z3493 Encounter for supervision of normal pregnancy, unspecified, third trimester: Secondary | ICD-10-CM

## 2016-06-27 LAB — POCT URINALYSIS DIPSTICK
BILIRUBIN UA: NEGATIVE
Blood, UA: NEGATIVE
GLUCOSE UA: NEGATIVE
KETONES UA: NEGATIVE
LEUKOCYTES UA: NEGATIVE
Nitrite, UA: NEGATIVE
Protein, UA: NEGATIVE
Spec Grav, UA: 1.01 (ref 1.010–1.025)
UROBILINOGEN UA: NEGATIVE U/dL — AB
pH, UA: 5 (ref 5.0–8.0)

## 2016-06-27 NOTE — Progress Notes (Signed)
ROB:  No problems - refused exam today.  S&S of labor discussed.

## 2016-07-04 ENCOUNTER — Ambulatory Visit (INDEPENDENT_AMBULATORY_CARE_PROVIDER_SITE_OTHER): Payer: BLUE CROSS/BLUE SHIELD | Admitting: Obstetrics and Gynecology

## 2016-07-04 VITALS — BP 112/72 | HR 99 | Wt 152.9 lb

## 2016-07-04 DIAGNOSIS — Z3483 Encounter for supervision of other normal pregnancy, third trimester: Secondary | ICD-10-CM

## 2016-07-04 LAB — POCT URINALYSIS DIPSTICK
Bilirubin, UA: NEGATIVE
Glucose, UA: NEGATIVE
Ketones, UA: NEGATIVE
NITRITE UA: NEGATIVE
PH UA: 8 (ref 5.0–8.0)
PROTEIN UA: NEGATIVE
UROBILINOGEN UA: 0.2 U/dL

## 2016-07-04 NOTE — Progress Notes (Signed)
ROB: Doing well, no complaints. Reiterated labor precautions.  RTC in 1 week

## 2016-07-09 ENCOUNTER — Inpatient Hospital Stay: Payer: BLUE CROSS/BLUE SHIELD | Admitting: Registered Nurse

## 2016-07-09 ENCOUNTER — Inpatient Hospital Stay
Admission: EM | Admit: 2016-07-09 | Discharge: 2016-07-11 | DRG: 775 | Disposition: A | Discharge status: Home / Self Care | Payer: BLUE CROSS/BLUE SHIELD | Attending: Obstetrics and Gynecology | Admitting: Obstetrics and Gynecology

## 2016-07-09 DIAGNOSIS — Z8249 Family history of ischemic heart disease and other diseases of the circulatory system: Secondary | ICD-10-CM

## 2016-07-09 DIAGNOSIS — O4202 Full-term premature rupture of membranes, onset of labor within 24 hours of rupture: Secondary | ICD-10-CM | POA: Diagnosis present

## 2016-07-09 DIAGNOSIS — Z3A39 39 weeks gestation of pregnancy: Secondary | ICD-10-CM

## 2016-07-09 DIAGNOSIS — Z3490 Encounter for supervision of normal pregnancy, unspecified, unspecified trimester: Secondary | ICD-10-CM

## 2016-07-09 LAB — CBC
HEMATOCRIT: 38.2 % (ref 35.0–47.0)
Hemoglobin: 12.8 g/dL (ref 12.0–16.0)
MCH: 31.9 pg (ref 26.0–34.0)
MCHC: 33.6 g/dL (ref 32.0–36.0)
MCV: 94.8 fL (ref 80.0–100.0)
Platelets: 201 10*3/uL (ref 150–440)
RBC: 4.03 MIL/uL (ref 3.80–5.20)
RDW: 13.9 % (ref 11.5–14.5)
WBC: 13.9 10*3/uL — ABNORMAL HIGH (ref 3.6–11.0)

## 2016-07-09 LAB — URINE DRUG SCREEN, QUALITATIVE (ARMC ONLY)
AMPHETAMINES, UR SCREEN: NOT DETECTED
Barbiturates, Ur Screen: NOT DETECTED
Benzodiazepine, Ur Scrn: NOT DETECTED
Cannabinoid 50 Ng, Ur ~~LOC~~: POSITIVE — AB
Cocaine Metabolite,Ur ~~LOC~~: NOT DETECTED
MDMA (ECSTASY) UR SCREEN: NOT DETECTED
METHADONE SCREEN, URINE: NOT DETECTED
Opiate, Ur Screen: NOT DETECTED
Phencyclidine (PCP) Ur S: NOT DETECTED
Tricyclic, Ur Screen: NOT DETECTED

## 2016-07-09 LAB — TYPE AND SCREEN
ABO/RH(D): A POS
Antibody Screen: NEGATIVE

## 2016-07-09 LAB — CHLAMYDIA/NGC RT PCR (ARMC ONLY)
Chlamydia Tr: NOT DETECTED
N GONORRHOEAE: NOT DETECTED

## 2016-07-09 MED ORDER — LIDOCAINE-EPINEPHRINE (PF) 1.5 %-1:200000 IJ SOLN
INTRAMUSCULAR | Status: DC | PRN
  Administered 2016-07-09: 3 mL via EPIDURAL

## 2016-07-09 MED ORDER — ACETAMINOPHEN 325 MG PO TABS
650.0000 mg | ORAL_TABLET | ORAL | Status: DC | PRN
Start: 2016-07-09 — End: 2016-07-11

## 2016-07-09 MED ORDER — OXYCODONE-ACETAMINOPHEN 5-325 MG PO TABS: 2.0000 | ORAL_TABLET | ORAL | Status: DC | PRN

## 2016-07-09 MED ORDER — DIPHENHYDRAMINE HCL 25 MG PO CAPS: 25.0000 mg | ORAL_CAPSULE | Freq: Four times a day (QID) | ORAL | Status: DC | PRN

## 2016-07-09 MED ORDER — OXYTOCIN 40 UNITS IN LACTATED RINGERS INFUSION - SIMPLE MED
2.5000 [IU]/h | INTRAVENOUS | Status: DC
  Filled 2016-07-09: qty 1000

## 2016-07-09 MED ORDER — TETANUS-DIPHTH-ACELL PERTUSSIS 5-2.5-18.5 LF-MCG/0.5 IM SUSP: 0.5000 mL | Freq: Once | INTRAMUSCULAR | Status: DC

## 2016-07-09 MED ORDER — BUTORPHANOL TARTRATE 2 MG/ML IJ SOLN
1.0000 mg | INTRAMUSCULAR | Status: DC | PRN
Start: 2016-07-09 — End: 2016-07-10

## 2016-07-09 MED ORDER — SOD CITRATE-CITRIC ACID 500-334 MG/5ML PO SOLN
30.0000 mL | ORAL | Status: DC | PRN
  Filled 2016-07-09: qty 30

## 2016-07-09 MED ORDER — IBUPROFEN 600 MG PO TABS
600.0000 mg | ORAL_TABLET | Freq: Four times a day (QID) | ORAL | Status: DC
  Administered 2016-07-09 – 2016-07-11 (×6): 600 mg via ORAL
  Filled 2016-07-09 (×7): qty 1

## 2016-07-09 MED ORDER — OXYTOCIN BOLUS FROM INFUSION: 500.0000 mL | Freq: Once | INTRAVENOUS | Status: DC

## 2016-07-09 MED ORDER — LACTATED RINGERS IV SOLN
INTRAVENOUS | Status: DC
  Administered 2016-07-09: 06:00:00 via INTRAVENOUS

## 2016-07-09 MED ORDER — BENZOCAINE-MENTHOL 20-0.5 % EX AERO
1.0000 "application " | INHALATION_SPRAY | CUTANEOUS | Status: DC | PRN
  Administered 2016-07-09: 1 via TOPICAL
  Filled 2016-07-09: qty 56

## 2016-07-09 MED ORDER — DIBUCAINE 1 % RE OINT: 1.0000 "application " | TOPICAL_OINTMENT | RECTAL | Status: DC | PRN

## 2016-07-09 MED ORDER — OXYTOCIN 40 UNITS IN LACTATED RINGERS INFUSION - SIMPLE MED
1.0000 m[IU]/min | INTRAVENOUS | Status: DC
  Administered 2016-07-09: 2 m[IU]/min via INTRAVENOUS

## 2016-07-09 MED ORDER — LACTATED RINGERS IV SOLN
500.0000 mL | INTRAVENOUS | Status: DC | PRN
Start: 2016-07-09 — End: 2016-07-10

## 2016-07-09 MED ORDER — TERBUTALINE SULFATE 1 MG/ML IJ SOLN: 0.2500 mg | Freq: Once | INTRAMUSCULAR | Status: DC | PRN

## 2016-07-09 MED ORDER — AMMONIA AROMATIC IN INHA
RESPIRATORY_TRACT | Status: AC
  Filled 2016-07-09: qty 10

## 2016-07-09 MED ORDER — ZOLPIDEM TARTRATE 5 MG PO TABS: 5.0000 mg | ORAL_TABLET | Freq: Every evening | ORAL | Status: DC | PRN

## 2016-07-09 MED ORDER — DOCUSATE SODIUM 100 MG PO CAPS
100.0000 mg | ORAL_CAPSULE | Freq: Two times a day (BID) | ORAL | Status: DC
  Administered 2016-07-09 – 2016-07-11 (×4): 100 mg via ORAL
  Filled 2016-07-09 (×6): qty 1

## 2016-07-09 MED ORDER — PRENATAL MULTIVITAMIN CH
1.0000 | ORAL_TABLET | Freq: Every day | ORAL | Status: DC
  Administered 2016-07-10: 1 via ORAL
  Filled 2016-07-09: qty 1

## 2016-07-09 MED ORDER — ONDANSETRON HCL 4 MG/2ML IJ SOLN: 4.0000 mg | INTRAMUSCULAR | Status: DC | PRN

## 2016-07-09 MED ORDER — WITCH HAZEL-GLYCERIN EX PADS: 1.0000 "application " | MEDICATED_PAD | CUTANEOUS | Status: DC | PRN

## 2016-07-09 MED ORDER — LIDOCAINE HCL (PF) 1 % IJ SOLN
INTRAMUSCULAR | Status: DC | PRN
  Administered 2016-07-09: 3 mL via SUBCUTANEOUS

## 2016-07-09 MED ORDER — LIDOCAINE HCL (PF) 1 % IJ SOLN
INTRAMUSCULAR | Status: AC
  Filled 2016-07-09: qty 30

## 2016-07-09 MED ORDER — ONDANSETRON HCL 4 MG/2ML IJ SOLN
4.0000 mg | Freq: Four times a day (QID) | INTRAMUSCULAR | Status: DC | PRN
  Administered 2016-07-09: 4 mg via INTRAVENOUS
  Filled 2016-07-09: qty 2

## 2016-07-09 MED ORDER — FENTANYL 2.5 MCG/ML W/ROPIVACAINE 0.2% IN NS 100 ML EPIDURAL INFUSION (ARMC-ANES)
EPIDURAL | Status: AC
  Administered 2016-07-09: 10:00:00
  Filled 2016-07-09: qty 100

## 2016-07-09 MED ORDER — BUPIVACAINE HCL (PF) 0.25 % IJ SOLN
INTRAMUSCULAR | Status: DC | PRN
  Administered 2016-07-09 (×2): 4 mL via EPIDURAL

## 2016-07-09 MED ORDER — OXYTOCIN 10 UNIT/ML IJ SOLN
INTRAMUSCULAR | Status: AC
  Filled 2016-07-09: qty 2

## 2016-07-09 MED ORDER — MISOPROSTOL 200 MCG PO TABS
ORAL_TABLET | ORAL | Status: AC
  Filled 2016-07-09: qty 4

## 2016-07-09 MED ORDER — FENTANYL 2.5 MCG/ML W/ROPIVACAINE 0.2% IN NS 100 ML EPIDURAL INFUSION (ARMC-ANES)
EPIDURAL | Status: DC | PRN
  Administered 2016-07-09: 10 mL/h via EPIDURAL

## 2016-07-09 MED ORDER — COCONUT OIL OIL
1.0000 | TOPICAL_OIL | Status: DC | PRN
Start: 2016-07-09 — End: 2016-07-11
  Administered 2016-07-10: 1 via TOPICAL
  Filled 2016-07-09: qty 120

## 2016-07-09 MED ORDER — LIDOCAINE HCL (PF) 1 % IJ SOLN: 30.0000 mL | INTRAMUSCULAR | Status: DC | PRN

## 2016-07-09 MED ORDER — OXYCODONE-ACETAMINOPHEN 5-325 MG PO TABS
1.0000 | ORAL_TABLET | ORAL | Status: DC | PRN
  Administered 2016-07-09 – 2016-07-10 (×2): 1 via ORAL
  Filled 2016-07-09 (×2): qty 1

## 2016-07-09 MED ORDER — OXYTOCIN 40 UNITS IN LACTATED RINGERS INFUSION - SIMPLE MED
2.5000 [IU]/h | INTRAVENOUS | Status: DC | PRN
  Filled 2016-07-09: qty 1000

## 2016-07-09 MED ORDER — ONDANSETRON HCL 4 MG PO TABS: 4.0000 mg | ORAL_TABLET | ORAL | Status: DC | PRN

## 2016-07-09 MED ORDER — SIMETHICONE 80 MG PO CHEW: 80.0000 mg | CHEWABLE_TABLET | ORAL | Status: DC | PRN

## 2016-07-09 NOTE — Anesthesia Preprocedure Evaluation (Signed)
Anesthesia Evaluation  Patient identified by MRN, date of birth, ID band Patient awake    Reviewed: Allergy & Precautions, H&P , Patient's Chart, lab work & pertinent test results  History of Anesthesia Complications (+) PONV and history of anesthetic complications  Airway Mallampati: II  TM Distance: >3 FB Neck ROM: full    Dental  (+) Teeth Intact   Pulmonary neg pulmonary ROS,           Cardiovascular negative cardio ROS       Neuro/Psych    GI/Hepatic negative GI ROS,   Endo/Other  negative endocrine ROS  Renal/GU negative Renal ROS     Musculoskeletal   Abdominal   Peds  Hematology negative hematology ROS (+)   Anesthesia Other Findings   Reproductive/Obstetrics (+) Pregnancy                             Anesthesia Physical Anesthesia Plan  ASA: II  Anesthesia Plan: Epidural   Post-op Pain Management:    Induction:   Airway Management Planned:   Additional Equipment:   Intra-op Plan:   Post-operative Plan:   Informed Consent: I have reviewed the patients History and Physical, chart, labs and discussed the procedure including the risks, benefits and alternatives for the proposed anesthesia with the patient or authorized representative who has indicated his/her understanding and acceptance.   Dental Advisory Given  Plan Discussed with: CRNA and Anesthesiologist  Anesthesia Plan Comments:         Anesthesia Quick Evaluation

## 2016-07-09 NOTE — Anesthesia Procedure Notes (Signed)
Epidural Patient location during procedure: OB Start time: 07/09/2016 9:34 AM End time: 07/09/2016 9:45 AM  Staffing Anesthesiologist: Naomie Dean Resident/CRNA: Karoline Caldwell Performed: resident/CRNA   Preanesthetic Checklist Completed: patient identified, site marked, surgical consent, pre-op evaluation, timeout performed, IV checked, risks and benefits discussed and monitors and equipment checked  Epidural Patient position: sitting Prep: ChloraPrep Patient monitoring: heart rate, continuous pulse ox and blood pressure Approach: midline Location: L4-L5 Injection technique: LOR saline  Needle:  Needle type: Tuohy  Needle gauge: 17 G Needle length: 9 cm and 9 Needle insertion depth: 6 cm Catheter type: closed end flexible Catheter size: 19 Gauge Catheter at skin depth: 11 cm Test dose: negative and 1.5% lidocaine with Epi 1:200 K  Assessment Events: blood not aspirated, injection not painful, no injection resistance, negative IV test and no paresthesia  Additional Notes Pt. Evaluated and documentation done after procedure finished. Patient identified. Risks/Benefits/Options discussed with patient including but not limited to bleeding, infection, nerve damage, paralysis, failed block, incomplete pain control, headache, blood pressure changes, nausea, vomiting, reactions to medication both or allergic, itching and postpartum back pain. Confirmed with bedside nurse the patient's most recent platelet count. Confirmed with patient that they are not currently taking any anticoagulation, have any bleeding history or any family history of bleeding disorders. Patient expressed understanding and wished to proceed. All questions were answered. Sterile technique was used throughout the entire procedure. Please see nursing notes for vital signs. Test dose was given through epidural catheter and negative prior to continuing to dose epidural or start infusion. Warning signs of high block  given to the patient including shortness of breath, tingling/numbness in hands, complete motor block, or any concerning symptoms with instructions to call for help. Patient was given instructions on fall risk and not to get out of bed. All questions and concerns addressed with instructions to call with any issues or inadequate analgesia.   Patient tolerated the insertion well without immediate complications.Reason for block:procedure for pain

## 2016-07-09 NOTE — Plan of Care (Signed)
RN notified Logan Bores, MD of fetal variable decelerations, interventions, and Pitocin off. MD reviewed fetal heart tracing remotely. MD gave verbal order to restart Pitocin and to call back with any concerns with fetal heart tracing.

## 2016-07-09 NOTE — H&P (Signed)
Mikayla Morales is Mikayla 26 y.o. female presenting for admission following SROM-Lt meconium at 0200 today. Contractions are approx 5 minutes apart. No Pre E symptoms. Good FM.  OB History    Gravida Para Term Preterm AB Living   2       1     SAB TAB Ectopic Multiple Live Births     1           Past Medical History:  Diagnosis Date  . Nausea & vomiting    Past Surgical History:  Procedure Laterality Date  . broke nose     Family History: family history includes Breast cancer in her maternal grandmother; Heart disease in her father. Social History:  reports that she has never smoked. She has never used smokeless tobacco. She reports that she does not drink alcohol or use drugs.   ROS  Good FM No Pre E sxs Painful contractions  History   Dilation: 2 Effacement (%): 80 Station: -1 Exam by:: Quintasha Gren, MD Blood pressure 118/76, pulse 90, temperature 98 F (36.7 C), temperature source Oral, resp. rate 18, height  (1.575 m), weight 152 lb (68.9 kg), last menstrual period 10/05/2015. Exam Physical Exam  Constitutional: She is oriented to person, place, and time. She appears well-developed and well-nourished.  HENT:  Head: Normocephalic and atraumatic.  Eyes: Conjunctivae are normal.  Cardiovascular: Normal rate and regular rhythm.   Respiratory: Effort normal.  GI: Soft. There is no tenderness.  Genitourinary:  Genitourinary Comments: Uterus non tender  Musculoskeletal: She exhibits no edema.  Neurological: She is alert and oriented to person, place, and time.  Skin: Skin is warm.    Prenatal labs: ABO, Rh: --/--/Mikayla POS (04/30 0447) Antibody: NEG (04/30 0447) Rubella: 1.31 (10/03 1050) RPR: Non Reactive (10/03 1050)  HBsAg: Negative (10/03 1050)  HIV: Non Reactive (10/03 1050)  GBS: Negative (03/28 1155)  Anatomy scan normal  Assessment/Plan: 39.5 week IUP with SROM Lt Meconium Category 1 tracing GBS negative Mikayla+ Blood   Labor  Management Epidural Pitocin Augmentation  Mikayla Morales Mikayla Morales 07/09/2016, 7:46 AM

## 2016-07-09 NOTE — OB Triage Note (Signed)
Patient arrived in triage with c/o painful contractions since approx 0130 this morning with leaking of fluid noted at 0245. States some red tinge to watery leaking, but no other vaginal bleeding. Positive fetal movement felt. Discussed plan of care. Pt verbalized understanding.

## 2016-07-10 LAB — RPR: RPR Ser Ql: NONREACTIVE

## 2016-07-10 NOTE — Anesthesia Postprocedure Evaluation (Signed)
Anesthesia Post Note  Patient: Mikayla Morales  Procedure(s) Performed: * No procedures listed *  Patient location during evaluation: Mother Baby Anesthesia Type: Epidural Level of consciousness: awake and alert and oriented Pain management: satisfactory to patient Vital Signs Assessment: post-procedure vital signs reviewed and stable Respiratory status: respiratory function stable Cardiovascular status: blood pressure returned to baseline Postop Assessment: no headache, no backache, epidural receding, patient able to bend at knees, no signs of nausea or vomiting and adequate PO intake Anesthetic complications: no     Last Vitals:  Vitals:   07/10/16 0804 07/10/16 1100  BP: 117/65 112/67  Pulse: 74 93  Resp: 20 18  Temp: 36.7 C 36.7 C    Last Pain:  Vitals:   07/10/16 1100  TempSrc: Oral  PainSc:                  Clydene Pugh

## 2016-07-10 NOTE — Clinical Social Work Maternal (Signed)
  CLINICAL SOCIAL WORK MATERNAL/CHILD NOTE  Patient Details  Name: Mikayla Morales MRN: [id number] Date of Birth: 12/09/1990  Date:  07/10/2016  Clinical Social Worker Initiating Note:  York Spaniel MSW,LCSW Date/ Time Initiated:  07/10/16/      Child's Name:      Legal Guardian:  Mother   Need for Interpreter:  None   Date of Referral:        Reason for Referral:  Current Substance Use/Substance Use During Pregnancy    Referral Source:  RN   Address:     Phone number:      Household Members:  Significant Other, Self   Natural Supports (not living in the home):  Immediate Family   Professional Supports: None   Employment: Full-time   Type of Work:     Education:      Architect:  Media planner   Other Resources:      Cultural/Religious Considerations Which May Impact Care:  none  Strengths:  Ability to meet basic needs , Home prepared for child    Risk Factors/Current Problems:  Substance Use    Cognitive State:  Alert    Mood/Affect:  Calm , Comfortable , Relaxed    CSW Assessment: CSW consulted on patient's newborn due to patient testing positive for marijuana in her urine drug screen. Upon entering patient's room, patient had a visitor whom patient stated is her mother. CSW asked if she wanted her mother in the room during my assessment and patient stated mother could stay. CSW spoke with patient and introduced self and discussed CSW role and purpose of visit. Patient informed CSW that this is her first child and that in the home will be her, her newborn, and father of baby. Patient reports that she has all necessities for her newborn (included by not limited to car seat and place for newborn to sleep) and has transportation as well. Patient reports that both her and her significant other work full time and that she will be returning to work after her maternity leave.  Patient denies any history of mental illness and states that during her  prenatal care, she received education regarding postpartum depression and is aware of the signs and symptoms. Patient denies alcohol use but does admit to marijuana use throughout her pregnancy. Patient reports that she used marijuana in the first and last trimester to assist with nausea and increasing appetite. Patient stated that she believes that the pros out way the cons of marijuana use and that she has no concerns with using marijuana. CSW explained that patient's newborn's urine drug screen was pending and that the cord tissue is pending but that if it comes back positive, that a DSS CPS report will be made. Patient verbalized understanding and stated she figured that a report would be made. Patient confirms she resides in [place]. Patient would not confirm or deny continued marijuana use post delivery. Patient denies other drug use.   Patient currently has no further concerns and had appropriate questions regarding if a DSS report would need to be made. CSW provided answers and support.  CSW Plan/Description:  Information/Referral to [employer], [place] 07/10/2016, 10:50 AM

## 2016-07-10 NOTE — Progress Notes (Signed)
Patient ID: Mikayla Morales, female   DOB: 07-17-1990, 26 y.o.   MRN: [id number]  Progress Note - Vaginal Delivery  Mikayla Morales is a 26 y.o. G2P1011 now PP day 1 s/p Vaginal, Spontaneous Delivery .   Subjective:  The patient reports no complaints  Objective:  Vital signs in last 24 hours: Temp:  [98 F (36.7 C)-98.7 F (37.1 C)] 98 F (36.7 C) (05/01 0804) Pulse Rate:  [71-119] 74 (05/01 0804) Resp:  [18-20] 20 (05/01 0804) BP: (81-129)/(46-84) 117/65 (05/01 0804) SpO2:  [98 %-100 %] 100 % (05/01 0804)  Physical Exam:  General: alert, cooperative and no distress Lochia: appropriate Uterine Fundus: firm    Data Review  Recent Labs  07/09/16 0447  HGB 12.8  HCT 38.2    Assessment/Plan: Active Problems:   Pregnancy   Status post delivery at term   Plan for Discharge tomorrow  -- Continue routine PP care.     Mikayla Morales, M.D. 07/10/2016 8:47 AM

## 2016-07-11 ENCOUNTER — Encounter: Payer: BLUE CROSS/BLUE SHIELD | Admitting: Obstetrics and Gynecology

## 2016-07-11 ENCOUNTER — Ambulatory Visit: Payer: Self-pay

## 2016-07-11 NOTE — Lactation Note (Signed)
This note was copied from a baby's chart. Lactation Consultation Note  Patient Name: Mikayla Morales [id number] Date: 07/11/2016  During [facility] rounds this morning, Mom stated that breastfeeding was going better, but that her nipples still hurt. Her left nipple looked more bruised than yesterday. I did notice that she was not holding baby close to her by end of the feedign that I walked in on. That pulling on the breast could truly cause that kind of trauma. I gently reminded her to hold baby closer to her using blankets/pillows etc to "wedge" baby closer to breast as she holds her. I reminded her of breast care, how to tell baby is well nourished; gave her [facility] and [facility] support group info.    Maternal Data    Feeding Feeding Type: Breast Fed Length of feed: 20 min  LATCH Score/Interventions                      Lactation Tools Discussed/Used     Consult Status      Sunday Corn 07/11/2016, 12:35 PM

## 2016-07-11 NOTE — Progress Notes (Signed)
Reviewed all patients discharge instructions and handouts regarding postpartum bleeding, no intercourse for 6 weeks, signs and symptoms of mastitis and postpartum bleu's. Reviewed discharge instructions for newborn regarding proper cord care, how and when to bathe the newborn, nail care, proper way to take the baby's temperature, along with safe sleep. All questions have been answered at this time. Patient discharged via wheelchair with axillary.  

## 2016-07-11 NOTE — Discharge Instructions (Signed)
Vaginal Delivery, Care After °Refer to this sheet in the next few weeks. These instructions provide you with information about caring for yourself after vaginal delivery. Your health care provider may also give you more specific instructions. Your treatment has been planned according to current medical practices, but problems sometimes occur. Call your health care provider if you have any problems or questions. °What can I expect after the procedure? °After vaginal delivery, it is common to have: °· Some bleeding from your vagina. °· Soreness in your abdomen, your vagina, and the area of skin between your vaginal opening and your anus (perineum). °· Pelvic cramps. °· Fatigue. ° °Follow these instructions at home: °Medicines °· Take over-the-counter and prescription medicines only as told by your health care provider. °· If you were prescribed an antibiotic medicine, take it as told by your health care provider. Do not stop taking the antibiotic until it is finished. °Driving ° °· Do not drive or operate heavy machinery while taking prescription pain medicine. °· Do not drive for 24 hours if you received a sedative. °Lifestyle °· Do not drink alcohol. This is especially important if you are breastfeeding or taking medicine to relieve pain. °· Do not use tobacco products, including cigarettes, chewing tobacco, or e-cigarettes. If you need help quitting, ask your health care provider. °Eating and drinking °· Drink at least 8 eight-ounce glasses of water every day unless you are told not to by your health care provider. If you choose to breastfeed your baby, you may need to drink more water than this. °· Eat high-fiber foods every day. These foods may help prevent or relieve constipation. High-fiber foods include: °? Whole grain cereals and breads. °? Brown rice. °? Beans. °? Fresh fruits and vegetables. °Activity °· Return to your normal activities as told by your health care provider. Ask your health care provider  what activities are safe for you. °· Rest as much as possible. Try to rest or take a nap when your baby is sleeping. °· Do not lift anything that is heavier than your baby or 10 lb (4.5 kg) until your health care provider says that it is safe. °· Talk with your health care provider about when you can engage in sexual activity. This may depend on your: °? Risk of infection. °? Rate of healing. °? Comfort and desire to engage in sexual activity. °Vaginal Care °· If you have an episiotomy or a vaginal tear, check the area every day for signs of infection. Check for: °? More redness, swelling, or pain. °? More fluid or blood. °? Warmth. °? Pus or a bad smell. °· Do not use tampons or douches until your health care provider says this is safe. °· Watch for any blood clots that may pass from your vagina. These may look like clumps of dark red, brown, or black discharge. °General instructions °· Keep your perineum clean and dry as told by your health care provider. °· Wear loose, comfortable clothing. °· Wipe from front to back when you use the toilet. °· Ask your health care provider if you can shower or take a bath. If you had an episiotomy or a perineal tear during labor and delivery, your health care provider may tell you not to take baths for a certain length of time. °· Wear a bra that supports your breasts and fits you well. °· If possible, have someone help you with household activities and help care for your baby for at least a few days after   you leave the hospital. °· Keep all follow-up visits for you and your baby as told by your health care provider. This is important. °Contact a health care provider if: °· You have: °? Vaginal discharge that has a bad smell. °? Difficulty urinating. °? Pain when urinating. °? A sudden increase or decrease in the frequency of your bowel movements. °? More redness, swelling, or pain around your episiotomy or vaginal tear. °? More fluid or blood coming from your episiotomy or  vaginal tear. °? Pus or a bad smell coming from your episiotomy or vaginal tear. °? A fever. °? A rash. °? Little or no interest in activities you used to enjoy. °? Questions about caring for yourself or your baby. °· Your episiotomy or vaginal tear feels warm to the touch. °· Your episiotomy or vaginal tear is separating or does not appear to be healing. °· Your breasts are painful, hard, or turn red. °· You feel unusually sad or worried. °· You feel nauseous or you vomit. °· You pass large blood clots from your vagina. If you pass a blood clot from your vagina, save it to show to your health care provider. Do not flush blood clots down the toilet without having your health care provider look at them. °· You urinate more than usual. °· You are dizzy or light-headed. °· You have not breastfed at all and you have not had a menstrual period for 12 weeks after delivery. °· You have stopped breastfeeding and you have not had a menstrual period for 12 weeks after you stopped breastfeeding. °Get help right away if: °· You have: °? Pain that does not go away or does not get better with medicine. °? Chest pain. °? Difficulty breathing. °? Blurred vision or spots in your vision. °? Thoughts about hurting yourself or your baby. °· You develop pain in your abdomen or in one of your legs. °· You develop a severe headache. °· You faint. °· You bleed from your vagina so much that you fill two sanitary pads in one hour. °This information is not intended to replace advice given to you by your health care provider. Make sure you discuss any questions you have with your health care provider. °Document Released: 02/24/2000 Document Revised: 08/10/2015 Document Reviewed: 03/13/2015 °Elsevier Interactive Patient Education © 2017 [employer] ° ° °Breastfeeding °Deciding to breastfeed is one of the best choices you can make for you and your baby. A change in hormones during pregnancy causes your breast tissue to grow and increases the  number and size of your milk ducts. These hormones also allow proteins, sugars, and fats from your blood supply to make breast milk in your milk-producing glands. Hormones prevent breast milk from being released before your baby is born as well as prompt milk flow after birth. Once breastfeeding has begun, thoughts of your baby, as well as his or her sucking or crying, can stimulate the release of milk from your milk-producing glands. °Benefits of breastfeeding °For Your Baby °· Your first milk (colostrum) helps your baby's digestive system function better. °· There are antibodies in your milk that help your baby fight off infections. °· Your baby has a lower incidence of asthma, allergies, and sudden infant death syndrome. °· The nutrients in breast milk are better for your baby than infant formulas and are designed uniquely for your baby’s needs. °· Breast milk improves your baby's brain development. °· Your baby is less likely to develop other conditions, such as childhood obesity,   asthma, or type 2 diabetes mellitus. ° °For You °· Breastfeeding helps to create a very special bond between you and your baby. °· Breastfeeding is convenient. Breast milk is always available at the correct temperature and costs nothing. °· Breastfeeding helps to burn calories and helps you lose the weight gained during pregnancy. °· Breastfeeding makes your uterus contract to its prepregnancy size faster and slows bleeding (lochia) after you give birth. °· Breastfeeding helps to lower your risk of developing type 2 diabetes mellitus, osteoporosis, and breast or ovarian cancer later in life. ° °Signs that your baby is hungry °Early Signs of Hunger °· Increased alertness or activity. °· Stretching. °· Movement of the head from side to side. °· Movement of the head and opening of the mouth when the corner of the mouth or cheek is stroked (rooting). °· Increased sucking sounds, smacking lips, cooing, sighing, or  squeaking. °· Hand-to-mouth movements. °· Increased sucking of fingers or hands. ° °Late Signs of Hunger °· Fussing. °· Intermittent crying. ° °Extreme Signs of Hunger °Signs of extreme hunger will require calming and consoling before your baby will be able to breastfeed successfully. Do not wait for the following signs of extreme hunger to occur before you initiate breastfeeding: °· Restlessness. °· A loud, strong cry. °· Screaming. ° °Breastfeeding basics °Breastfeeding Initiation °· Find a comfortable place to sit or lie down, with your neck and back well supported. °· Place a pillow or rolled up blanket under your baby to bring him or her to the level of your breast (if you are seated). Nursing pillows are specially designed to help support your arms and your baby while you breastfeed. °· Make sure that your baby's abdomen is facing your abdomen. °· Gently massage your breast. With your fingertips, massage from your chest wall toward your nipple in a circular motion. This encourages milk flow. You may need to continue this action during the feeding if your milk flows slowly. °· Support your breast with 4 fingers underneath and your thumb above your nipple. Make sure your fingers are well away from your nipple and your baby’s mouth. °· Stroke your baby's lips gently with your finger or nipple. °· When your baby's mouth is open wide enough, quickly bring your baby to your breast, placing your entire nipple and as much of the colored area around your nipple (areola) as possible into your baby's mouth. °? More areola should be visible above your baby's upper lip than below the lower lip. °? Your baby's tongue should be between his or her lower gum and your breast. °· Ensure that your baby's mouth is correctly positioned around your nipple (latched). Your baby's lips should create a seal on your breast and be turned out (everted). °· It is common for your baby to suck about 2-3 minutes in order to start the flow of  breast milk. ° °Latching °Teaching your baby how to latch on to your breast properly is very important. An improper latch can cause nipple pain and decreased milk supply for you and poor weight gain in your baby. Also, if your baby is not latched onto your nipple properly, he or she may swallow some air during feeding. This can make your baby fussy. Burping your baby when you switch breasts during the feeding can help to get rid of the air. However, teaching your baby to latch on properly is still the best way to prevent fussiness from swallowing air while breastfeeding. °Signs that your baby has successfully   latched on to your nipple: °· Silent tugging or silent sucking, without causing you pain. °· Swallowing heard between every 3-4 sucks. °· Muscle movement above and in front of his or her ears while sucking. ° °Signs that your baby has not successfully latched on to nipple: °· Sucking sounds or smacking sounds from your baby while breastfeeding. °· Nipple pain. ° °If you think your baby has not latched on correctly, slip your finger into the corner of your baby’s mouth to break the suction and place it between your baby's gums. Attempt breastfeeding initiation again. °Signs of Successful Breastfeeding °Signs from your baby: °· A gradual decrease in the number of sucks or complete cessation of sucking. °· Falling asleep. °· Relaxation of his or her body. °· Retention of a small amount of milk in his or her mouth. °· Letting go of your breast by himself or herself. ° °Signs from you: °· Breasts that have increased in firmness, weight, and size 1-3 hours after feeding. °· Breasts that are softer immediately after breastfeeding. °· Increased milk volume, as well as a change in milk consistency and color by the fifth day of breastfeeding. °· Nipples that are not sore, cracked, or bleeding. ° °Signs That Your Baby is Getting Enough Milk °· Wetting at least 1-2 diapers during the first 24 hours after birth. °· Wetting  at least 5-6 diapers every 24 hours for the first week after birth. The urine should be clear or pale yellow by 5 days after birth. °· Wetting 6-8 diapers every 24 hours as your baby continues to grow and develop. °· At least 3 stools in a 24-hour period by age 5 days. The stool should be soft and yellow. °· At least 3 stools in a 24-hour period by age 7 days. The stool should be seedy and yellow. °· No loss of weight greater than 10% of birth weight during the first 3 days of age. °· Average weight gain of 4-7 ounces (113-198 g) per week after age 4 days. °· Consistent daily weight gain by age 5 days, without weight loss after the age of 2 weeks. ° °After a feeding, your baby may spit up a small amount. This is common. °Breastfeeding frequency and duration °Frequent feeding will help you make more milk and can prevent sore nipples and breast engorgement. Breastfeed when you feel the need to reduce the fullness of your breasts or when your baby shows signs of hunger. This is called "breastfeeding on demand." Avoid introducing a pacifier to your baby while you are working to establish breastfeeding (the first 4-6 weeks after your baby is born). After this time you may choose to use a pacifier. Research has shown that pacifier use during the first year of a baby's life decreases the risk of sudden infant death syndrome (SIDS). °Allow your baby to feed on each breast as long as he or she wants. Breastfeed until your baby is finished feeding. When your baby unlatches or falls asleep while feeding from the first breast, offer the second breast. Because newborns are often sleepy in the first few weeks of life, you may need to awaken your baby to get him or her to feed. °Breastfeeding times will vary from baby to baby. However, the following rules can serve as a guide to help you ensure that your baby is properly fed: °· Newborns (babies 4 weeks of age or younger) may breastfeed every 1-3 hours. °· Newborns should not go  longer than 3 hours during   the day or 5 hours during the night without breastfeeding. °· You should breastfeed your baby a minimum of 8 times in a 24-hour period until you begin to introduce solid foods to your baby at around 6 months of age. ° °Breast milk pumping °Pumping and storing breast milk allows you to ensure that your baby is exclusively fed your breast milk, even at times when you are unable to breastfeed. This is especially important if you are going back to work while you are still breastfeeding or when you are not able to be present during feedings. Your lactation consultant can give you guidelines on how long it is safe to store breast milk. °A breast pump is a machine that allows you to pump milk from your breast into a sterile bottle. The pumped breast milk can then be stored in a refrigerator or freezer. Some breast pumps are operated by hand, while others use electricity. Ask your lactation consultant which type will work best for you. Breast pumps can be purchased, but some hospitals and breastfeeding support groups lease breast pumps on a monthly basis. A lactation consultant can teach you how to hand express breast milk, if you prefer not to use a pump. °Caring for your breasts while you breastfeed °Nipples can become dry, cracked, and sore while breastfeeding. The following recommendations can help keep your breasts moisturized and healthy: °· Avoid using soap on your nipples. °· Wear a supportive bra. Although not required, special nursing bras and tank tops are designed to allow access to your breasts for breastfeeding without taking off your entire bra or top. Avoid wearing underwire-style bras or extremely tight bras. °· Air dry your nipples for 3-4 minutes after each feeding. °· Use only cotton bra pads to absorb leaked breast milk. Leaking of breast milk between feedings is normal. °· Use lanolin on your nipples after breastfeeding. Lanolin helps to maintain your skin's normal moisture  barrier. If you use pure lanolin, you do not need to wash it off before feeding your baby again. Pure lanolin is not toxic to your baby. You may also hand express a few drops of breast milk and gently massage that milk into your nipples and allow the milk to air dry. ° °In the first few weeks after giving birth, some women experience extremely full breasts (engorgement). Engorgement can make your breasts feel heavy, warm, and tender to the touch. Engorgement peaks within 3-5 days after you give birth. The following recommendations can help ease engorgement: °· Completely empty your breasts while breastfeeding or pumping. You may want to start by applying warm, moist heat (in the shower or with warm water-soaked hand towels) just before feeding or pumping. This increases circulation and helps the milk flow. If your baby does not completely empty your breasts while breastfeeding, pump any extra milk after he or she is finished. °· Wear a snug bra (nursing or regular) or tank top for 1-2 days to signal your body to slightly decrease milk production. °· Apply ice packs to your breasts, unless this is too uncomfortable for you. °· Make sure that your baby is latched on and positioned properly while breastfeeding. ° °If engorgement persists after 48 hours of following these recommendations, contact your health care provider or a lactation consultant. °Overall health care recommendations while breastfeeding °· Eat healthy foods. Alternate between meals and snacks, eating 3 of each per day. Because what you eat affects your breast milk, some of the foods may make your baby more irritable than   usual. Avoid eating these foods if you are sure that they are negatively affecting your baby. °· Drink milk, fruit juice, and water to satisfy your thirst (about 10 glasses a day). °· Rest often, relax, and continue to take your prenatal vitamins to prevent fatigue, stress, and anemia. °· Continue breast self-awareness checks. °· Avoid  chewing and smoking tobacco. Chemicals from cigarettes that pass into breast milk and exposure to secondhand smoke may harm your baby. °· Avoid alcohol and drug use, including marijuana. °Some medicines that may be harmful to your baby can pass through breast milk. It is important to ask your health care provider before taking any medicine, including all over-the-counter and prescription medicine as well as vitamin and herbal supplements. °It is possible to become pregnant while breastfeeding. If birth control is desired, ask your health care provider about options that will be safe for your baby. °Contact a health care provider if: °· You feel like you want to stop breastfeeding or have become frustrated with breastfeeding. °· You have painful breasts or nipples. °· Your nipples are cracked or bleeding. °· Your breasts are red, tender, or warm. °· You have a swollen area on either breast. °· You have a fever or chills. °· You have nausea or vomiting. °· You have drainage other than breast milk from your nipples. °· Your breasts do not become full before feedings by the fifth day after you give birth. °· You feel sad and depressed. °· Your baby is too sleepy to eat well. °· Your baby is having trouble sleeping. °· Your baby is wetting less than 3 diapers in a 24-hour period. °· Your baby has less than 3 stools in a 24-hour period. °· Your baby's skin or the white part of his or her eyes becomes yellow. °· Your baby is not gaining weight by 5 days of age. °Get help right away if: °· Your baby is overly tired (lethargic) and does not want to wake up and feed. °· Your baby develops an unexplained fever. °This information is not intended to replace advice given to you by your health care provider. Make sure you discuss any questions you have with your health care provider. °Document Released: 02/26/2005 Document Revised: 08/10/2015 Document Reviewed: 08/20/2012 °Elsevier Interactive Patient Education © 2017 [employer] ° ° °

## 2016-07-11 NOTE — Discharge Summary (Signed)
                             Discharge Summary  Date of Admission: 07/09/2016  Date of Discharge:   Admitting Diagnosis: Premature rupture of membrane at [redacted]w[redacted]d  Mode of Delivery: vaginal Pitocin induced     Discharge Diagnosis: No other diagnosis   Intrapartum Procedures: laceration 2nd  Complications: none                      Discharge Day SOAP Note:  Progress Note - Vaginal Delivery  Mikayla Morales is a 26 y.o. G2P1011 now PP day 2 s/p Vaginal, Spontaneous Delivery . Delivery was uncomplicated  Subjective  The patient has the following complaints: has no unusual complaints  Pain is controlled with current medications.   Patient is urinating without difficulty.  She is ambulating well.    Objective  Vital signs: BP 108/78 (BP Location: Left Arm)   Pulse 87   Temp 98.6 F (37 C) (Oral)   Resp 18   Ht  (1.575 m)   Wt 152 lb (68.9 kg)   LMP 10/05/2015 (Exact Date)   SpO2 98%   Breastfeeding? Unknown   BMI 27.80 kg/m   Physical Exam: Gen: NAD Fundus Fundal Tone: Firm  Lochia Amount: Scant  Perineum Appearance: Edematous     Data Review Labs: CBC Latest Ref Rng & Units 07/09/2016 04/24/2016 12/13/2015  WBC 3.6 - 11.0 K/uL 13.9(H) - 12.7(H)  Hemoglobin 12.0 - 16.0 g/dL 29.5 - -  Hematocrit 28.4 - 47.0 % 38.2 39.0 38.1  Platelets 150 - 440 K/uL 201 - 228   A POS  Assessment/Plan  Active Problems:   Pregnancy   Status post delivery at term    Plan for discharge today.   Discharge Instructions: Per After Visit Summary. Activity: Advance as tolerated. Pelvic rest for 6 weeks.  Also refer to After Visit Summary Diet: Regular Medications:  Outpatient follow up:  Follow-up Information    Brennan Bailey, MD. Schedule an appointment as soon as possible for a visit in 6 week(s).   Specialty:  Obstetrics and Gynecology Why:  please call [facility] and make your 6 week follow up appointment Contact information: [address] [place] [address] [phone]          Postpartum contraception: IUD  considering Discharged Condition: good  Discharged to: home  Newborn Data: Disposition:home with mother  Apgars: APGAR (1 MIN): 8   APGAR (5 MINS): 9   APGAR (10 MINS):    Baby Feeding: Breast    Elonda Husky, M.D. 07/11/2016 8:42 AM

## 2016-08-29 ENCOUNTER — Encounter: Payer: Self-pay | Admitting: Obstetrics and Gynecology

## 2016-08-29 ENCOUNTER — Ambulatory Visit (INDEPENDENT_AMBULATORY_CARE_PROVIDER_SITE_OTHER): Payer: BLUE CROSS/BLUE SHIELD | Admitting: Obstetrics and Gynecology

## 2016-08-29 NOTE — Progress Notes (Signed)
HPI:      [name] is a 26 y.o. G2P1011 who LMP was No LMP recorded.  Subjective:   She presents today 6 weeks postpartum. She is breast-feeding. She says that she is generally doing well. She has not resumed intercourse. She plans to use condoms for birth control. Complains of occasional nipple soreness. She was worried that she may be developing a monilia infection on her nipples. She asked the pediatrician and the pediatrician stated that the baby does not have thrush.    Hx: The following portions of the patient's history were reviewed and updated as appropriate:             She  has a past medical history of Nausea & vomiting. She  does not have any pertinent problems on file. She  has a past surgical history that includes broke nose. Her family history includes Breast cancer in her maternal grandmother; Heart disease in her father. She  reports that she has never smoked. She has never used smokeless tobacco. She reports that she does not drink alcohol or use drugs. She has No Known Allergies.       Review of Systems:  Review of Systems  Constitutional: Denied constitutional symptoms, night sweats, recent illness, fatigue, fever, insomnia and weight loss.  Eyes: Denied eye symptoms, eye pain, photophobia, vision change and visual disturbance.  Ears/Nose/Throat/Neck: Denied ear, nose, throat or neck symptoms, hearing loss, nasal discharge, sinus congestion and sore throat.  Cardiovascular: Denied cardiovascular symptoms, arrhythmia, chest pain/pressure, edema, exercise intolerance, orthopnea and palpitations.  Respiratory: Denied pulmonary symptoms, asthma, pleuritic pain, productive sputum, cough, dyspnea and wheezing.  Gastrointestinal: Denied, gastro-esophageal reflux, melena, nausea and vomiting.  Genitourinary: See HPI for additional information.  Musculoskeletal: Denied musculoskeletal symptoms, stiffness, swelling, muscle weakness and myalgia.  Dermatologic: Denied  dermatology symptoms, rash and scar.  Neurologic: Denied neurology symptoms, dizziness, headache, neck pain and syncope.  Psychiatric: Denied psychiatric symptoms, anxiety and depression.  Endocrine: Denied endocrine symptoms including hot flashes and night sweats.   Meds:   Current Outpatient Prescriptions on File Prior to Visit  Medication Sig Dispense Refill  . Pediatric Multiple Vit-C-FA (FLINSTONES GUMMIES OMEGA-3 DHA PO) Take by mouth.     No current facility-administered medications on file prior to visit.     Objective:     Vitals:   08/29/16 1052  BP: 97/66  Pulse: 92    Breast exam: No erythema nipples not shiny-strongly doubt monilia infection.            Pelvic examination   Pelvic:   Vulva: Normal appearance.  No lesions.  No abnormal scarring.    Vagina: No lesions or abnormalities noted.  Support: Normal pelvic support.  Urethra No masses tenderness or scarring.  Meatus Normal size without lesions or prolapse.  Cervix: Normal ectropion.  No lesions.  Anus: Normal exam.  No lesions.  Perineum: Normal exam.  No lesions.  Healed well.          Bimanual   Uterus: Normal size.  Non-tender.  Mobile.  AV.  Adnexae: No masses.  Non-tender to palpation.  Cul-de-sac: Negative for abnormality.    Assessment:    G2P1011 Patient Active Problem List   Diagnosis Date Noted  . Pregnancy 07/09/2016  . Status post delivery at term 07/09/2016  . H/O miscarriage, currently pregnant 12/29/2015  . Positive urine drug screen 12/20/2015  . Amenorrhea 11/29/2015  . Positive urine pregnancy test 11/29/2015  . Nausea/vomiting in pregnancy 11/29/2015  1. Postpartum care and examination immediately after delivery     Patient doing well. No evidence of breast monilia.   Plan:            1.  May resume normal activities.  2.  If symptoms change or worsen she to contact us regarding breast pain. Orders No orders of the defined types were placed in this  encounter.   No orders of the defined types were placed in this encounter.       F/U  Return in about 3 months (around 11/29/2016) for Annual Physical.  Elonda Husky, M.D. 08/29/2016 11:54 AM

## 2016-11-29 ENCOUNTER — Encounter: Payer: BLUE CROSS/BLUE SHIELD | Admitting: Obstetrics and Gynecology

## 2018-05-26 ENCOUNTER — Encounter: Payer: BLUE CROSS/BLUE SHIELD | Admitting: Obstetrics and Gynecology

## 2019-09-30 ENCOUNTER — Emergency Department
Admission: EM | Admit: 2019-09-30 | Discharge: 2019-09-30 | Disposition: A | Discharge status: Home / Self Care | Payer: Medicaid Other | Attending: Emergency Medicine | Admitting: Emergency Medicine

## 2019-09-30 ENCOUNTER — Other Ambulatory Visit: Payer: Self-pay

## 2019-09-30 DIAGNOSIS — Z3A10 10 weeks gestation of pregnancy: Secondary | ICD-10-CM | POA: Insufficient documentation

## 2019-09-30 DIAGNOSIS — R197 Diarrhea, unspecified: Secondary | ICD-10-CM | POA: Diagnosis not present

## 2019-09-30 DIAGNOSIS — O219 Vomiting of pregnancy, unspecified: Secondary | ICD-10-CM

## 2019-09-30 LAB — URINALYSIS, COMPLETE (UACMP) WITH MICROSCOPIC
Bilirubin Urine: NEGATIVE
Glucose, UA: NEGATIVE mg/dL
Ketones, ur: 80 mg/dL — AB
Leukocytes,Ua: NEGATIVE
Nitrite: NEGATIVE
Protein, ur: 30 mg/dL — AB
Specific Gravity, Urine: 1.032 — ABNORMAL HIGH (ref 1.005–1.030)
pH: 5 (ref 5.0–8.0)

## 2019-09-30 LAB — COMPREHENSIVE METABOLIC PANEL
ALT: 20 U/L (ref 0–44)
AST: 16 U/L (ref 15–41)
Albumin: 4.2 g/dL (ref 3.5–5.0)
Alkaline Phosphatase: 72 U/L (ref 38–126)
Anion gap: 10 (ref 5–15)
BUN: 10 mg/dL (ref 6–20)
CO2: 23 mmol/L (ref 22–32)
Calcium: 9.1 mg/dL (ref 8.9–10.3)
Chloride: 101 mmol/L (ref 98–111)
Creatinine, Ser: 0.58 mg/dL (ref 0.44–1.00)
GFR calc Af Amer: >60 mL/min (ref >60)
GFR calc non Af Amer: >60 mL/min (ref >60)
Glucose, Bld: 124 mg/dL — ABNORMAL HIGH (ref 70–99)
Potassium: 3.4 mmol/L — ABNORMAL LOW (ref 3.5–5.1)
Sodium: 134 mmol/L — ABNORMAL LOW (ref 135–145)
Total Bilirubin: 1.7 mg/dL — ABNORMAL HIGH (ref 0.3–1.2)
Total Protein: 7.5 g/dL (ref 6.5–8.1)

## 2019-09-30 LAB — CBC
HCT: 37.3 % (ref 36.0–46.0)
Hemoglobin: 12.9 g/dL (ref 12.0–15.0)
MCH: 30.9 pg (ref 26.0–34.0)
MCHC: 34.6 g/dL (ref 30.0–36.0)
MCV: 89.2 fL (ref 80.0–100.0)
Platelets: 281 10*3/uL (ref 150–400)
RBC: 4.18 MIL/uL (ref 3.87–5.11)
RDW: 13 % (ref 11.5–15.5)
WBC: 12.7 10*3/uL — ABNORMAL HIGH (ref 4.0–10.5)
nRBC: 0 % (ref 0.0–0.2)

## 2019-09-30 LAB — HCG, QUANTITATIVE, PREGNANCY: hCG, Beta Chain, Quant, S: 228303 m[IU]/mL — ABNORMAL HIGH (ref <5)

## 2019-09-30 LAB — LIPASE, BLOOD: Lipase: 27 U/L (ref 11–51)

## 2019-09-30 MED ORDER — ONDANSETRON 4 MG PO TBDP: 4.0000 mg | ORAL_TABLET | Freq: Three times a day (TID) | ORAL | 0 refills | Status: DC | PRN

## 2019-09-30 MED ORDER — SODIUM CHLORIDE 0.9 % IV SOLN
1000.0000 mL | Freq: Once | INTRAVENOUS | Status: AC
Start: 2019-09-30 — End: 2019-09-30
  Administered 2019-09-30: 10:00:00 1000 mL via INTRAVENOUS

## 2019-09-30 MED ORDER — SODIUM CHLORIDE 0.9 % IV BOLUS
500.0000 mL | Freq: Once | INTRAVENOUS | Status: AC
  Administered 2019-09-30: 500 mL via INTRAVENOUS

## 2019-09-30 MED ORDER — ONDANSETRON HCL 4 MG/2ML IJ SOLN
4.0000 mg | Freq: Once | INTRAMUSCULAR | Status: AC
  Administered 2019-09-30: 4 mg via INTRAVENOUS
  Filled 2019-09-30: qty 2

## 2019-09-30 MED ORDER — PROMETHAZINE HCL 25 MG/ML IJ SOLN
12.5000 mg | Freq: Once | INTRAMUSCULAR | Status: AC
Start: 2019-09-30 — End: 2019-09-30
  Administered 2019-09-30: 12:00:00 12.5 mg via INTRAVENOUS
  Filled 2019-09-30: qty 1

## 2019-09-30 MED ORDER — SODIUM CHLORIDE 0.9% FLUSH
3.0000 mL | Freq: Once | INTRAVENOUS | Status: AC
  Administered 2019-09-30: 10:00:00 3 mL via INTRAVENOUS

## 2019-09-30 NOTE — ED Triage Notes (Signed)
Pt c/o hyperemesis for the past 3 days, states she is about [redacted] weeks pregnant and has not been able to keep anything down. States she is having some abd pain but thinks it is related to all the vomiting.

## 2019-09-30 NOTE — ED Provider Notes (Signed)
[facility] Emergency Department Provider Note   ____________________________________________    I have reviewed the triage vital signs and the nursing notes.   HISTORY  Chief Complaint Emesis During Pregnancy     HPI Mikayla Morales is a 29 y.o. female who is a G2 P1 who reports she is approximately [redacted] weeks pregnant.  She has not seen OB yet.  She presents with nausea vomiting and diarrhea for approximately 3 days.  She thinks this may be related to foodborne illness from something she ate on Monday.  Denies sick contacts.  Reports with her first pregnancy she had hyperemesis gravidarum however with this pregnancy it has been much better.  Has not take anything for this besides ginger.  No fevers or chills.  No abdominal pain.  No vaginal bleeding.  Past Medical History:  Diagnosis Date  . Nausea & vomiting     Patient Active Problem List   Diagnosis Date Noted  . Pregnancy 07/09/2016  . Status post delivery at term 07/09/2016  . H/O miscarriage, currently pregnant 12/29/2015  . Positive urine drug screen 12/20/2015  . Amenorrhea 11/29/2015  . Positive urine pregnancy test 11/29/2015  . Nausea/vomiting in pregnancy 11/29/2015    Past Surgical History:  Procedure Laterality Date  . broke nose      Prior to Admission medications   Medication Sig Start Date End Date Taking? Authorizing Provider  ondansetron (ZOFRAN ODT) 4 MG disintegrating tablet Take 1 tablet (4 mg total) by mouth every 8 (eight) hours as needed. 09/30/19   Jene Every, MD  Pediatric Multiple Vit-C-FA (FLINSTONES GUMMIES OMEGA-3 DHA PO) Take by mouth.    [provider]     Allergies Patient has no known allergies.  Family History  Problem Relation Age of Onset  . Heart disease Father   . Breast cancer Maternal Grandmother   . Ovarian cancer Neg Hx   . Colon cancer Neg Hx   . Diabetes Neg Hx     Social History Social History   Tobacco Use  .  Smoking status: Never Smoker  . Smokeless tobacco: Never Used  Vaping Use  . Vaping Use: Never used  Substance Use Topics  . Alcohol use: Not Currently  . Drug use: Not Currently    Review of Systems  Constitutional: No fever/chills Eyes: No visual changes.  ENT: No congestion Cardiovascular: Denies chest pain. Respiratory: No cough Gastrointestinal: As above Genitourinary: As above Musculoskeletal: Negative for joint swelling Skin: Negative for rash. Neurological: Negative for headaches   ____________________________________________   PHYSICAL EXAM:  VITAL SIGNS: ED Triage Vitals  Enc Vitals Group     BP 09/30/19 0837 114/75     Pulse Rate 09/30/19 0837 82     Resp 09/30/19 0837 18     Temp 09/30/19 0837 98.2 F (36.8 C)     Temp Source 09/30/19 0837 Oral     SpO2 09/30/19 0837 100 %     Weight 09/30/19 0836 59 kg (130 lb)     Height 09/30/19 0836 1.575 m (5\' 2" )     Head Circumference --      Peak Flow --      Pain Score 09/30/19 0833 5     Pain Loc --      Pain Edu? --      Excl. in GC? --     Constitutional: Alert and oriented. No acute distress. Eyes: Conjunctivae are normal.  Head: Atraumatic. Nose: No congestion/rhinnorhea.  Cardiovascular: Normal  rate, regular rhythm. Good peripheral circulation. Respiratory: Normal respiratory effort.  No retractions.  Gastrointestinal: Soft and nontender. No distention. .  Musculoskeletal: No lower extremity tenderness nor edema.  Warm and well perfused Neurologic:  Normal speech and language. No gross focal neurologic deficits are appreciated.  Skin:  Skin is warm, dry and intact. No rash noted. Psychiatric: Mood and affect are normal. Speech and behavior are normal.  ____________________________________________   LABS (all labs ordered are listed, but only abnormal results are displayed)  Labs Reviewed  COMPREHENSIVE METABOLIC PANEL - Abnormal; Notable for the following components:      Result Value    Sodium 134 (*)    Potassium 3.4 (*)    Glucose, Bld 124 (*)    Total Bilirubin 1.7 (*)    All other components within normal limits  CBC - Abnormal; Notable for the following components:   WBC 12.7 (*)    All other components within normal limits  URINALYSIS, COMPLETE (UACMP) WITH MICROSCOPIC - Abnormal; Notable for the following components:   Color, Urine AMBER (*)    APPearance CLOUDY (*)    Specific Gravity, Urine 1.032 (*)    Hgb urine dipstick SMALL (*)    Ketones, ur 80 (*)    Protein, ur 30 (*)    Bacteria, UA RARE (*)    All other components within normal limits  HCG, QUANTITATIVE, PREGNANCY - Abnormal; Notable for the following components:   hCG, Beta Chain, Quant, S 228,303 (*)    All other components within normal limits  LIPASE, BLOOD   ____________________________________________  EKG  None ____________________________________________  RADIOLOGY   ____________________________________________   PROCEDURES  Procedure(s) performed: No  Procedures   Critical Care performed: No ____________________________________________   INITIAL IMPRESSION / ASSESSMENT AND PLAN / ED COURSE  Pertinent labs & imaging results that were available during my care of the patient were reviewed by me and considered in my medical decision making (see chart for details).  Patient overall well-appearing and in no acute distress presents with complaints of nausea vomiting and diarrhea.  Differential includes morning sickness, viral gastroenteritis, foodborne illness.  Lab work notable for mild elevation in white blood cell count which is nonspecific.  Urinalysis demonstrates some ketones consistent with dehydration.  CMP demonstrates very mild hyponatremia and hypokalemia.  Will treat with IV fluids, antiemetics and reevaluate.  Patient feeling much better after treatment, reports she has to go home to take care of her child while her husband goes to work and is asking to be  discharged    ____________________________________________   FINAL CLINICAL IMPRESSION(S) / ED DIAGNOSES  Final diagnoses:  Nausea/vomiting in pregnancy        Note:  This document was prepared using Dragon voice recognition software and may include unintentional dictation errors.   Jene Every, MD 09/30/19 [phone]

## 2019-09-30 NOTE — ED Notes (Signed)
Patient complaining of shortness of breathe and feeling "woozy" VSS, MD Kinner notified

## 2019-09-30 NOTE — ED Notes (Signed)
Patient discharged home, patient received discharge papers and prescription. Patient appropriate and cooperative. Vital signs taken. NAD noted. 

## 2019-10-12 ENCOUNTER — Telehealth: Payer: Self-pay

## 2019-10-12 NOTE — Telephone Encounter (Signed)
Called pt couldn't leave a message. The pt was seen at ED and is 10- [redacted] weeks pregnant wanted to she if she wanted to come to [facility] for her prenatal care.

## 2019-10-21 ENCOUNTER — Telehealth: Payer: Self-pay | Admitting: Certified Nurse Midwife

## 2019-10-21 NOTE — Telephone Encounter (Signed)
Pt  Called in and stated that she went to [facility] and was confirmed pregnant there. The pt told me her lmp was [id number] (13 weeks 4 days) the pt was requesting to see JML. I put the pt on for 8/13 8:45am. I told the pt I will send a message if this time didn't work we will call her to move it. The pt verbally understood. Please advise

## 2019-10-23 ENCOUNTER — Ambulatory Visit (INDEPENDENT_AMBULATORY_CARE_PROVIDER_SITE_OTHER): Payer: Medicaid Other | Admitting: Certified Nurse Midwife

## 2019-10-23 ENCOUNTER — Encounter: Payer: Self-pay | Admitting: Certified Nurse Midwife

## 2019-10-23 ENCOUNTER — Other Ambulatory Visit (HOSPITAL_COMMUNITY)
Admission: RE | Admit: 2019-10-23 | Discharge: 2019-10-23 | Disposition: A | Discharge status: Home / Self Care | Payer: Medicaid Other | Source: Ambulatory Visit | Attending: Certified Nurse Midwife | Admitting: Certified Nurse Midwife

## 2019-10-23 ENCOUNTER — Other Ambulatory Visit: Payer: Self-pay

## 2019-10-23 VITALS — BP 92/65 | HR 89 | Wt 117.0 lb

## 2019-10-23 DIAGNOSIS — O219 Vomiting of pregnancy, unspecified: Secondary | ICD-10-CM | POA: Diagnosis not present

## 2019-10-23 DIAGNOSIS — Z124 Encounter for screening for malignant neoplasm of cervix: Secondary | ICD-10-CM | POA: Insufficient documentation

## 2019-10-23 DIAGNOSIS — Z3491 Encounter for supervision of normal pregnancy, unspecified, first trimester: Secondary | ICD-10-CM | POA: Insufficient documentation

## 2019-10-23 DIAGNOSIS — Z3A13 13 weeks gestation of pregnancy: Secondary | ICD-10-CM | POA: Diagnosis not present

## 2019-10-23 DIAGNOSIS — Z3687 Encounter for antenatal screening for uncertain dates: Secondary | ICD-10-CM | POA: Diagnosis not present

## 2019-10-23 LAB — POCT URINALYSIS DIPSTICK OB
Bilirubin, UA: NEGATIVE
Blood, UA: NEGATIVE
Glucose, UA: NEGATIVE
Ketones, UA: NEGATIVE
Leukocytes, UA: NEGATIVE
Nitrite, UA: NEGATIVE
POC,PROTEIN,UA: NEGATIVE
Spec Grav, UA: <=1.005 — AB (ref 1.010–1.025)
Urobilinogen, UA: 0.2 E.U./dL
pH, UA: 7 (ref 5.0–8.0)

## 2019-10-23 LAB — OB RESULTS CONSOLE VARICELLA ZOSTER ANTIBODY, IGG: Varicella: IMMUNE

## 2019-10-23 NOTE — Patient Instructions (Signed)
Healthy Weight Gain During Pregnancy, Adult A certain amount of weight gain during pregnancy is normal and healthy. How much weight you should gain depends on your overall health and a measurement called BMI (body mass index). BMI is an estimate of your body fat based on your height and weight. You can use an online calculator to figure out your BMI, or you can ask your health care provider to calculate it for you at your next visit. Your recommended pregnancy weight gain is based on your pre-pregnancy BMI. General guidelines for a healthy total weight gain during pregnancy are listed below. If your BMI at or before the start of your pregnancy is:  Less than 18.5 (underweight), you should gain 28-40 lb (13-18 kg).  18.5-24.9 (normal weight), you should gain 25-35 lb (11-16 kg).  25-29.9 (overweight), you should gain 15-25 lb (7-11 kg).  30 or higher (obese), you should gain 11-20 lb (5-9 kg). These ranges vary depending on your individual health. If you are carrying more than one baby (multiples), it may be safe to gain more weight than these recommendations. If you gain less weight than recommended, that may be safe as long as your baby is growing and developing normally. How can unhealthy weight gain affect me and my baby? Gaining too much weight during pregnancy can lead to pregnancy complications, such as:  A temporary form of diabetes that develops during pregnancy (gestational diabetes).  High blood pressure during pregnancy and protein in your urine (preeclampsia).  High blood pressure during pregnancy without protein in your urine (gestational hypertension).  Your baby having a high weight at birth, which may: ? Raise your risk of having a more difficult delivery or a surgical delivery (cesarean delivery, or C-section). ? Raise your child's risk of developing obesity during childhood. Not gaining enough weight can be life-threatening for your baby, and it may raise your baby's chances  of:  Being born early (preterm).  Growing more slowly than normal during pregnancy (growth restriction).  Having a low weight at birth. What actions can I take to gain a healthy amount of weight during pregnancy? General instructions  Keep track of your weight gain during pregnancy.  Take over-the-counter and prescription medicines only as told by your health care provider. Take all prenatal supplements as directed.  Keep all health care visits during pregnancy (prenatal visits). These visits are a good time to discuss your weight gain. Your health care provider will weigh you at each visit to make sure you are gaining a healthy amount of weight. Nutrition   Eat a balanced, nutrient-rich diet. Eat plenty of: ? Fruits and vegetables, such as berries and broccoli. ? Whole grains, such as millet, barley, whole-wheat breads and cereals, and oatmeal. ? Low-fat dairy products or non-dairy products such as almond milk or rice milk. ? Protein foods, such as lean meat, chicken, eggs, and legumes (such as peas, beans, soybeans, and lentils).  Avoid foods that are fried or have a lot of fat, salt (sodium), or sugar.  Drink enough fluid to keep your urine pale yellow.  Choose healthy snack and drink options when you are at work or on the go: ? Drink water. Avoid soda, sports drinks, and juices that have added sugar. ? Avoid drinks with caffeine, such as coffee and energy drinks. ? Eat snacks that are high in protein, such as nuts, protein bars, and low-fat yogurt. ? Carry convenient snacks in your purse that do not need refrigeration, such as a pack of   trail mix, an apple, or a granola bar.  If you need help improving your diet, work with a health care provider or a diet and nutrition specialist (dietitian). Activity   Exercise regularly, as told by your health care provider. ? If you were active before becoming pregnant, you may be able to continue your regular fitness activities. ? If  you were not active before pregnancy, you may gradually build up to exercising for 30 or more minutes on most days of the week. This may include walking, swimming, or yoga.  Ask your health care provider what activities are safe for you. Talk with your health care provider about whether you may need to be excused from certain school or work activities. Where to find more information Learn more about managing your weight gain during pregnancy from:  American Pregnancy Association: www.americanpregnancy.org  [place] Department of Agriculture pregnancy weight gain calculator: [employer] Summary  Too much weight gain during pregnancy can lead to complications for you and your baby.  Find out your pre-pregnancy BMI to determine how much weight gain is healthy for you.  Eat nutritious foods and stay active.  Keep all of your prenatal visits as told by your health care provider. This information is not intended to replace advice given to you by your health care provider. Make sure you discuss any questions you have with your health care provider. Document Revised: 11/19/2018 Document Reviewed: 11/16/2016 Elsevier Patient Education  [place].   Exercise During Pregnancy Exercise is an important part of being healthy for people of all ages. Exercise improves the function of your heart and lungs and helps you maintain strength, flexibility, and a healthy body weight. Exercise also boosts energy levels and elevates mood. Most women should exercise regularly during pregnancy. In rare cases, women with certain medical conditions or complications may be asked to limit or avoid exercise during pregnancy. How does this affect me? Along with maintaining general strength and flexibility, exercising during pregnancy can help:  Keep strength in muscles that are used during labor and childbirth.  Decrease low back pain.  Reduce symptoms of depression.  Control weight gain during  pregnancy.  Reduce the risk of needing insulin if you develop diabetes during pregnancy.  Decrease the risk of cesarean delivery.  Speed up your recovery after giving birth. How does this affect my baby? Exercise can help you have a healthy pregnancy. Exercise does not cause premature birth. It will not cause your baby to weigh less at birth. What exercises can I do? Many exercises are safe for you to do during pregnancy. Do a variety of exercises that safely increase your heart and breathing rates and help you build and maintain muscle strength. Do exercises exactly as told by your health care provider. You may do these exercises:  Walking or hiking.  Swimming.  Water aerobics.  Riding a stationary bike.  Strength training.  Modified yoga or Pilates. Tell your instructor that you are pregnant. Avoid overstretching, and avoid lying on your back for long periods of time.  Running or jogging. Only choose this type of exercise if you: ? Ran or jogged regularly before your pregnancy. ? Can run or jog and still talk in complete sentences. What exercises should I avoid? Depending on your level of fitness and whether you exercised regularly before your pregnancy, you may be told to limit high-intensity exercise. You can tell that you are exercising at a high intensity if you are breathing much harder and faster and  cannot hold a conversation while exercising. You must avoid:  Contact sports.  Activities that put you at risk for falling on or being hit in the belly, such as downhill skiing, water skiing, surfing, rock climbing, cycling, gymnastics, and horseback riding.  Scuba diving.  Skydiving.  Yoga or Pilates in a room that is heated to high temperatures.  Jogging or running, unless you ran or jogged regularly before your pregnancy. While jogging or running, you should always be able to talk in full sentences. Do not run or jog so fast that you are unable to have a  conversation.  Do not exercise at more than 6,000 feet above sea level (high elevation) if you are not used to exercising at high elevation. How do I exercise in a safe way?   Avoid overheating. Do not exercise in very high temperatures.  Wear loose-fitting, breathable clothes.  Avoid dehydration. Drink enough water before, during, and after exercise to keep your urine pale yellow.  Avoid overstretching. Because of hormone changes during pregnancy, it is easy to overstretch muscles, tendons, and ligaments during pregnancy.  Start slowly and ask your health care provider to recommend the types of exercise that are safe for you.  Do not exercise to lose weight. Follow these instructions at home:  Exercise on most days or all days of the week. Try to exercise for 30 minutes a day, 5 days a week, unless your health care provider tells you not to.  If you actively exercised before your pregnancy and you are healthy, your health care provider may tell you to continue to do moderate to high-intensity exercise.  If you are just starting to exercise or did not exercise much before your pregnancy, your health care provider may tell you to do low to moderate-intensity exercise. Questions to ask your health care provider  Is exercise safe for me?  What are signs that I should stop exercising?  Does my health condition mean that I should not exercise during pregnancy?  When should I avoid exercising during pregnancy? Stop exercising and contact a health care provider if: You have any unusual symptoms, such as:  Mild contractions of the uterus or cramps in the abdomen.  Dizziness that does not go away when you rest. Stop exercising and get help right away if: You have any unusual symptoms, such as:  Sudden, severe pain in your low back or your belly.  Mild contractions of the uterus or cramps in the abdomen that do not improve with rest and drinking fluids.  Chest pain.  Bleeding or  fluid leaking from your vagina.  Shortness of breath. These symptoms may represent a serious problem that is an emergency. Do not wait to see if the symptoms will go away. Get medical help right away. Call your local emergency services (911 in the [place] Do not drive yourself to the hospital. Summary  Most women should exercise regularly throughout pregnancy. In rare cases, women with certain medical conditions or complications may be asked to limit or avoid exercise during pregnancy.  Do not exercise to lose weight during pregnancy.  Your health care provider will tell you what level of physical activity is right for you.  Stop exercising and contact a health care provider if you have mild contractions of the uterus or cramps in the abdomen. Get help right away if these contractions or cramps do not improve with rest and drinking fluids.  Stop exercising and get help right away if you have sudden, severe  pain in your low back or belly, chest pain, shortness of breath, or bleeding or leaking of fluid from your vagina. This information is not intended to replace advice given to you by your health care provider. Make sure you discuss any questions you have with your health care provider. Document Revised: 06/19/2018 Document Reviewed: 04/02/2018 Elsevier Patient Education  [place].   Breastfeeding  Choosing to breastfeed is one of the best decisions you can make for yourself and your baby. A change in hormones during pregnancy causes your breasts to make breast milk in your milk-producing glands. Hormones prevent breast milk from being released before your baby is born. They also prompt milk flow after birth. Once breastfeeding has begun, thoughts of your baby, as well as his or her sucking or crying, can stimulate the release of milk from your milk-producing glands. Benefits of breastfeeding Research shows that breastfeeding offers many health benefits for infants and mothers. It  also offers a cost-free and convenient way to feed your baby. For your baby  Your first milk (colostrum) helps your baby's digestive system to function better.  Special cells in your milk (antibodies) help your baby to fight off infections.  Breastfed babies are less likely to develop asthma, allergies, obesity, or type 2 diabetes. They are also at lower risk for sudden infant death syndrome (SIDS).  Nutrients in breast milk are better able to meet your baby's needs compared to infant formula.  Breast milk improves your baby's brain development. For you  Breastfeeding helps to create a very special bond between you and your baby.  Breastfeeding is convenient. Breast milk costs nothing and is always available at the correct temperature.  Breastfeeding helps to burn calories. It helps you to lose the weight that you gained during pregnancy.  Breastfeeding makes your uterus return faster to its size before pregnancy. It also slows bleeding (lochia) after you give birth.  Breastfeeding helps to lower your risk of developing type 2 diabetes, osteoporosis, rheumatoid arthritis, cardiovascular disease, and breast, ovarian, uterine, and endometrial cancer later in life. Breastfeeding basics Starting breastfeeding  Find a comfortable place to sit or lie down, with your neck and back well-supported.  Place a pillow or a rolled-up blanket under your baby to bring him or her to the level of your breast (if you are seated). Nursing pillows are specially designed to help support your arms and your baby while you breastfeed.  Make sure that your baby's tummy (abdomen) is facing your abdomen.  Gently massage your breast. With your fingertips, massage from the outer edges of your breast inward toward the nipple. This encourages milk flow. If your milk flows slowly, you may need to continue this action during the feeding.  Support your breast with 4 fingers underneath and your thumb above your nipple  (make the letter "C" with your hand). Make sure your fingers are well away from your nipple and your baby's mouth.  Stroke your baby's lips gently with your finger or nipple.  When your baby's mouth is open wide enough, quickly bring your baby to your breast, placing your entire nipple and as much of the areola as possible into your baby's mouth. The areola is the colored area around your nipple. ? More areola should be visible above your baby's upper lip than below the lower lip. ? Your baby's lips should be opened and extended outward (flanged) to ensure an adequate, comfortable latch. ? Your baby's tongue should be between his or her lower  gum and your breast.  Make sure that your baby's mouth is correctly positioned around your nipple (latched). Your baby's lips should create a seal on your breast and be turned out (everted).  It is common for your baby to suck about 2-3 minutes in order to start the flow of breast milk. Latching Teaching your baby how to latch onto your breast properly is very important. An improper latch can cause nipple pain, decreased milk supply, and poor weight gain in your baby. Also, if your baby is not latched onto your nipple properly, he or she may swallow some air during feeding. This can make your baby fussy. Burping your baby when you switch breasts during the feeding can help to get rid of the air. However, teaching your baby to latch on properly is still the best way to prevent fussiness from swallowing air while breastfeeding. Signs that your baby has successfully latched onto your nipple  Silent tugging or silent sucking, without causing you pain. Infant's lips should be extended outward (flanged).  Swallowing heard between every 3-4 sucks once your milk has started to flow (after your let-down milk reflex occurs).  Muscle movement above and in front of his or her ears while sucking. Signs that your baby has not successfully latched onto your  nipple  Sucking sounds or smacking sounds from your baby while breastfeeding.  Nipple pain. If you think your baby has not latched on correctly, slip your finger into the corner of your baby's mouth to break the suction and place it between your baby's gums. Attempt to start breastfeeding again. Signs of successful breastfeeding Signs from your baby  Your baby will gradually decrease the number of sucks or will completely stop sucking.  Your baby will fall asleep.  Your baby's body will relax.  Your baby will retain a small amount of milk in his or her mouth.  Your baby will let go of your breast by himself or herself. Signs from you  Breasts that have increased in firmness, weight, and size 1-3 hours after feeding.  Breasts that are softer immediately after breastfeeding.  Increased milk volume, as well as a change in milk consistency and color by the fifth day of breastfeeding.  Nipples that are not sore, cracked, or bleeding. Signs that your baby is getting enough milk  Wetting at least 1-2 diapers during the first 24 hours after birth.  Wetting at least 5-6 diapers every 24 hours for the first week after birth. The urine should be clear or pale yellow by the age of 5 days.  Wetting 6-8 diapers every 24 hours as your baby continues to grow and develop.  At least 3 stools in a 24-hour period by the age of 5 days. The stool should be soft and yellow.  At least 3 stools in a 24-hour period by the age of 7 days. The stool should be seedy and yellow.  No loss of weight greater than 10% of birth weight during the first 3 days of life.  Average weight gain of 4-7 oz (113-198 g) per week after the age of 4 days.  Consistent daily weight gain by the age of 5 days, without weight loss after the age of 2 weeks. After a feeding, your baby may spit up a small amount of milk. This is normal. Breastfeeding frequency and duration Frequent feeding will help you make more milk and can  prevent sore nipples and extremely full breasts (breast engorgement). Breastfeed when you feel the need  to reduce the fullness of your breasts or when your baby shows signs of hunger. This is called "breastfeeding on demand." Signs that your baby is hungry include:  Increased alertness, activity, or restlessness.  Movement of the head from side to side.  Opening of the mouth when the corner of the mouth or cheek is stroked (rooting).  Increased sucking sounds, smacking lips, cooing, sighing, or squeaking.  Hand-to-mouth movements and sucking on fingers or hands.  Fussing or crying. Avoid introducing a pacifier to your baby in the first 4-6 weeks after your baby is born. After this time, you may choose to use a pacifier. Research has shown that pacifier use during the first year of a baby's life decreases the risk of sudden infant death syndrome (SIDS). Allow your baby to feed on each breast as long as he or she wants. When your baby unlatches or falls asleep while feeding from the first breast, offer the second breast. Because newborns are often sleepy in the first few weeks of life, you may need to awaken your baby to get him or her to feed. Breastfeeding times will vary from baby to baby. However, the following rules can serve as a guide to help you make sure that your baby is properly fed:  Newborns (babies 31 weeks of age or younger) may breastfeed every 1-3 hours.  Newborns should not go without breastfeeding for longer than 3 hours during the day or 5 hours during the night.  You should breastfeed your baby a minimum of 8 times in a 24-hour period. Breast milk pumping     Pumping and storing breast milk allows you to make sure that your baby is exclusively fed your breast milk, even at times when you are unable to breastfeed. This is especially important if you go back to work while you are still breastfeeding, or if you are not able to be present during feedings. Your lactation  consultant can help you find a method of pumping that works best for you and give you guidelines about how long it is safe to store breast milk. Caring for your breasts while you breastfeed Nipples can become dry, cracked, and sore while breastfeeding. The following recommendations can help keep your breasts moisturized and healthy:  Avoid using soap on your nipples.  Wear a supportive bra designed especially for nursing. Avoid wearing underwire-style bras or extremely tight bras (sports bras).  Air-dry your nipples for 3-4 minutes after each feeding.  Use only cotton bra pads to absorb leaked breast milk. Leaking of breast milk between feedings is normal.  Use lanolin on your nipples after breastfeeding. Lanolin helps to maintain your skin's normal moisture barrier. Pure lanolin is not harmful (not toxic) to your baby. You may also hand express a few drops of breast milk and gently massage that milk into your nipples and allow the milk to air-dry. In the first few weeks after giving birth, some women experience breast engorgement. Engorgement can make your breasts feel heavy, warm, and tender to the touch. Engorgement peaks within 3-5 days after you give birth. The following recommendations can help to ease engorgement:  Completely empty your breasts while breastfeeding or pumping. You may want to start by applying warm, moist heat (in the shower or with warm, water-soaked hand towels) just before feeding or pumping. This increases circulation and helps the milk flow. If your baby does not completely empty your breasts while breastfeeding, pump any extra milk after he or she is finished.  Apply ice packs to your breasts immediately after breastfeeding or pumping, unless this is too uncomfortable for you. To do this: ? Put ice in a plastic bag. ? Place a towel between your skin and the bag. ? Leave the ice on for 20 minutes, 2-3 times a day.  Make sure that your baby is latched on and  positioned properly while breastfeeding. If engorgement persists after 48 hours of following these recommendations, contact your health care provider or a Science writer. Overall health care recommendations while breastfeeding  Eat 3 healthy meals and 3 snacks every day. Well-nourished mothers who are breastfeeding need an additional 450-500 calories a day. You can meet this requirement by increasing the amount of a balanced diet that you eat.  Drink enough water to keep your urine pale yellow or clear.  Rest often, relax, and continue to take your prenatal vitamins to prevent fatigue, stress, and low vitamin and mineral levels in your body (nutrient deficiencies).  Do not use any products that contain nicotine or tobacco, such as cigarettes and e-cigarettes. Your baby may be harmed by chemicals from cigarettes that pass into breast milk and exposure to secondhand smoke. If you need help quitting, ask your health care provider.  Avoid alcohol.  Do not use illegal drugs or marijuana.  Talk with your health care provider before taking any medicines. These include over-the-counter and prescription medicines as well as vitamins and herbal supplements. Some medicines that may be harmful to your baby can pass through breast milk.  It is possible to become pregnant while breastfeeding. If birth control is desired, ask your health care provider about options that will be safe while breastfeeding your baby. Where to find more information: [employer]: www.llli.org Contact a health care provider if:  You feel like you want to stop breastfeeding or have become frustrated with breastfeeding.  Your nipples are cracked or bleeding.  Your breasts are red, tender, or warm.  You have: ? Painful breasts or nipples. ? A swollen area on either breast. ? A fever or chills. ? Nausea or vomiting. ? Drainage other than breast milk from your nipples.  Your breasts do not become  full before feedings by the fifth day after you give birth.  You feel sad and depressed.  Your baby is: ? Too sleepy to eat well. ? Having trouble sleeping. ? More than 59 week old and wetting fewer than 6 diapers in a 24-hour period. ? Not gaining weight by 71 days of age.  Your baby has fewer than 3 stools in a 24-hour period.  Your baby's skin or the white parts of his or her eyes become yellow. Get help right away if:  Your baby is overly tired (lethargic) and does not want to wake up and feed.  Your baby develops an unexplained fever. Summary  Breastfeeding offers many health benefits for infant and mothers.  Try to breastfeed your infant when he or she shows early signs of hunger.  Gently tickle or stroke your baby's lips with your finger or nipple to allow the baby to open his or her mouth. Bring the baby to your breast. Make sure that much of the areola is in your baby's mouth. Offer one side and burp the baby before you offer the other side.  Talk with your health care provider or lactation consultant if you have questions or you face problems as you breastfeed. This information is not intended to replace advice given to you by your  health care provider. Make sure you discuss any questions you have with your health care provider. Document Revised: 05/23/2017 Document Reviewed: 03/30/2016 Elsevier Patient Education  2020 [employer].   WHAT OB PATIENTS CAN EXPECT   Confirmation of pregnancy and ultrasound ordered if medically indicated-[redacted] weeks gestation  New OB (NOB) intake with nurse and New OB (NOB) labs- [redacted] weeks gestation  New OB (NOB) physical examination with provider- 11/[redacted] weeks gestation  Flu vaccine-[redacted] weeks gestation  Anatomy scan-[redacted] weeks gestation  Glucose tolerance test, blood work to test for anemia, T-dap vaccine-[redacted] weeks gestation  Vaginal swabs/cultures-STD/Group B strep-[redacted] weeks gestation  Appointments every 4 weeks until 28 weeks  Every 2  weeks from 28 weeks until 36 weeks  Weekly visits from 36 weeks until delivery    Common Medications Safe in Pregnancy  Acne:      Constipation:  Benzoyl Peroxide     Colace  Clindamycin      Dulcolax Suppository  Topica Erythromycin     Fibercon  Salicylic Acid      Metamucil         Miralax AVOID:        Senakot   Accutane    Cough:  Retin-A       Cough Drops  Tetracycline      Phenergan w/ Codeine if Rx  Minocycline      Robitussin (Plain & DM)  Antibiotics:     Crabs/Lice:  Ceclor       RID  Cephalosporins    AVOID:  E-Mycins      Kwell  Keflex  Macrobid/Macrodantin   Diarrhea:  Penicillin      Kao-Pectate  Zithromax      Imodium AD         PUSH FLUIDS AVOID:       Cipro     Fever:  Tetracycline      Tylenol (Regular or Extra  Minocycline       Strength)  Levaquin      Extra Strength-Do not          Exceed 8 tabs/24 hrs Caffeine:        '200mg'$ /day (equiv. To 1 cup of coffee or  approx. 3 12 oz sodas)         Gas: Cold/Hayfever:       Gas-X  Benadryl      Mylicon  Claritin       Phazyme  **Claritin-D        Chlor-Trimeton    Headaches:  Dimetapp      ASA-Free Excedrin  Drixoral-Non-Drowsy     Cold Compress  Mucinex (Guaifenasin)     Tylenol (Regular or Extra  Sudafed/Sudafed-12 Hour     Strength)  **Sudafed PE Pseudoephedrine   Tylenol Cold & Sinus     Vicks Vapor Rub  Zyrtec  **AVOID if Problems With Blood Pressure         Heartburn: Avoid lying down for at least 1 hour after meals  Aciphex      Maalox     Rash:  Milk of Magnesia     Benadryl    Mylanta       1% Hydrocortisone Cream  Pepcid  Pepcid Complete   Sleep Aids:  Prevacid      Ambien   Prilosec       Benadryl  Rolaids       Chamomile Tea  Tums (Limit 4/day)     Unisom  Tylenol PM         Warm milk-add vanilla or  Hemorrhoids:       Sugar for taste  Anusol/Anusol H.C.  (RX: Analapram 2.5%)  Sugar Substitutes:  Hydrocortisone OTC     Ok in moderation  Preparation  H      Tucks        Vaseline lotion applied to tissue with wiping    Herpes:     Throat:  Acyclovir      Oragel  Famvir  Valtrex     Vaccines:         Flu Shot Leg Cramps:       *Gardasil  Benadryl      Hepatitis A         Hepatitis B Nasal Spray:       Pneumovax  Saline Nasal Spray     Polio Booster         Tetanus Nausea:       Tuberculosis test or PPD  Vitamin B6 25 mg TID   AVOID:    Dramamine      *Gardasil  Emetrol       Live Poliovirus  Ginger Root 250 mg QID    MMR (measles, mumps &  High Complex Carbs @ Bedtime    rebella)  Sea Bands-Accupressure    Varicella (Chickenpox)  Unisom 1/2 tab TID     *No known complications           If received before Pain:         Known pregnancy;   Darvocet       Resume series after  Lortab        Delivery  Percocet    Yeast:   Tramadol      Femstat  Tylenol 3      Gyne-lotrimin  Ultram       Monistat  Vicodin           MISC:         All Sunscreens           Hair Coloring/highlights          Insect Repellant's          (Including DEET)         Mystic Tans   First Trimester of Pregnancy  The first trimester of pregnancy is from week 1 until the end of week 13 (months 1 through 3). During this time, your baby will begin to develop inside you. At 6-8 weeks, the eyes and face are formed, and the heartbeat can be seen on ultrasound. At the end of 12 weeks, all the baby's organs are formed. Prenatal care is all the medical care you receive before the birth of your baby. Make sure you get good prenatal care and follow all of your doctor's instructions. Follow these instructions at home: Medicines  Take over-the-counter and prescription medicines only as told by your doctor. Some medicines are safe and some medicines are not safe during pregnancy.  Take a prenatal vitamin that contains at least 600 micrograms (mcg) of folic acid.  If you have trouble pooping (constipation), take medicine that will make your stool soft (stool softener)  if your doctor approves. Eating and drinking   Eat regular, healthy meals.  Your doctor will tell you the amount of weight gain that is right for you.  Avoid raw meat and uncooked cheese.  If you feel sick to your stomach (nauseous) or throw up (vomit): ? Eat 4  or 5 small meals a day instead of 3 large meals. ? Try eating a few soda crackers. ? Drink liquids between meals instead of during meals.  To prevent constipation: ? Eat foods that are high in fiber, like fresh fruits and vegetables, whole grains, and beans. ? Drink enough fluids to keep your pee (urine) clear or pale yellow. Activity  Exercise only as told by your doctor. Stop exercising if you have cramps or pain in your lower belly (abdomen) or low back.  Do not exercise if it is too hot, too humid, or if you are in a place of great height (high altitude).  Try to avoid standing for long periods of time. Move your legs often if you must stand in one place for a long time.  Avoid heavy lifting.  Wear low-heeled shoes. Sit and stand up straight.  You can have sex unless your doctor tells you not to. Relieving pain and discomfort  Wear a good support bra if your breasts are sore.  Take warm water baths (sitz baths) to soothe pain or discomfort caused by hemorrhoids. Use hemorrhoid cream if your doctor says it is okay.  Rest with your legs raised if you have leg cramps or low back pain.  If you have puffy, bulging veins (varicose veins) in your legs: ? Wear support hose or compression stockings as told by your doctor. ? Raise (elevate) your feet for 15 minutes, 3-4 times a day. ? Limit salt in your food. Prenatal care  Schedule your prenatal visits by the twelfth week of pregnancy.  Write down your questions. Take them to your prenatal visits.  Keep all your prenatal visits as told by your doctor. This is important. Safety  Wear your seat belt at all times when driving.  Make a list of emergency phone  numbers. The list should include numbers for family, friends, the hospital, and police and fire departments. General instructions  Ask your doctor for a referral to a local prenatal class. Begin classes no later than at the start of month 6 of your pregnancy.  Ask for help if you need counseling or if you need help with nutrition. Your doctor can give you advice or tell you where to go for help.  Do not use hot tubs, steam rooms, or saunas.  Do not douche or use tampons or scented sanitary pads.  Do not cross your legs for long periods of time.  Avoid all herbs and alcohol. Avoid drugs that are not approved by your doctor.  Do not use any tobacco products, including cigarettes, chewing tobacco, and electronic cigarettes. If you need help quitting, ask your doctor. You may get counseling or other support to help you quit.  Avoid cat litter boxes and soil used by cats. These carry germs that can cause birth defects in the baby and can cause a loss of your baby (miscarriage) or stillbirth.  Visit your dentist. At home, brush your teeth with a soft toothbrush. Be gentle when you floss. Contact a doctor if:  You are dizzy.  You have mild cramps or pressure in your lower belly.  You have a nagging pain in your belly area.  You continue to feel sick to your stomach, you throw up, or you have watery poop (diarrhea).  You have a bad smelling fluid coming from your vagina.  You have pain when you pee (urinate).  You have increased puffiness (swelling) in your face, hands, legs, or ankles. Get help right away if:  You have a fever.  You are leaking fluid from your vagina.  You have spotting or bleeding from your vagina.  You have very bad belly cramping or pain.  You gain or lose weight rapidly.  You throw up blood. It may look like coffee grounds.  You are around people who have [place] measles, fifth disease, or chickenpox.  You have a very bad headache.  You have shortness  of breath.  You have any kind of trauma, such as from a fall or a car accident. Summary  The first trimester of pregnancy is from week 1 until the end of week 13 (months 1 through 3).  To take care of yourself and your unborn baby, you will need to eat healthy meals, take medicines only if your doctor tells you to do so, and do activities that are safe for you and your baby.  Keep all follow-up visits as told by your doctor. This is important as your doctor will have to ensure that your baby is healthy and growing well. This information is not intended to replace advice given to you by your health care provider. Make sure you discuss any questions you have with your health care provider. Document Revised: 06/19/2018 Document Reviewed: 03/06/2016 Elsevier Patient Education  2020 [employer].

## 2019-10-23 NOTE — Progress Notes (Signed)
Mikayla Morales presents for NOB appointment . Pregnancy confirmation done at [facility]. LMP 07/18/19.  G3 P1 0 1 1  . Pregnancy education material explained and given.0 cats in the home. NOB labs ordered. HIV labs and Drug screen were explained optional and she did decline. Drug screen declined. PNV encouraged. Genetic screening options discussed. Genetic testing: Declined.  Pt may discuss with provider.  NOB physical done today.  FMLA and financial policy reviewed and signed.

## 2019-10-23 NOTE — Progress Notes (Signed)
NEW OB HISTORY AND PHYSICAL  SUBJECTIVE:       Mikayla Morales is a 29 y.o. G5P1011 female, Patient's last menstrual period was 07/18/2019., Estimated Date of Delivery: 04/23/20, [redacted]w[redacted]d, presents today for establishment of Prenatal Care.  Pregnancy confirmed at [facility] ED on 09/30/2019, ultrasound not performed.   Endorses nausea with nightly vomiting, using home treatment measures.   Denies difficulty breathing or respiratory distress, chest pain, abdominal pain, vaginal bleeding, dysuria, and leg pain or swelling.    Gynecologic History  Patient's last menstrual period was 07/18/2019.   Contraception: none  Last Pap: 2017. Results were: normal  Obstetric History  OB History  Gravida Para Term Preterm AB Living  3 1 1   1 1   SAB TAB Ectopic Multiple Live Births    1   0 1    # Outcome Date GA Lbr Len/2nd Weight Sex Delivery Anes PTL Lv  3 Current           2 Term 07/09/16 [redacted]w[redacted]d 13:15 / 02:08 7 lb 1.6 oz (3.22 kg) F Vag-Spont EPI  LIV  1 TAB 06/11/15 [redacted]w[redacted]d           Past Medical History:  Diagnosis Date  . Nausea & vomiting     Past Surgical History:  Procedure Laterality Date  . broke nose      Current Outpatient Medications on File Prior to Visit  Medication Sig Dispense Refill  . Prenatal Vit-Fe Fumarate-FA (MULTIVITAMIN-PRENATAL) 27-0.8 MG TABS tablet Take 1 tablet by mouth daily at 12 noon.    . ondansetron (ZOFRAN ODT) 4 MG disintegrating tablet Take 1 tablet (4 mg total) by mouth every 8 (eight) hours as needed. (Patient not taking: Reported on 10/23/2019) 20 tablet 0  . Pediatric Multiple Vit-C-FA (FLINSTONES GUMMIES OMEGA-3 DHA PO) Take by mouth. (Patient not taking: Reported on 10/23/2019)     No current facility-administered medications on file prior to visit.    No Known Allergies  Social History   Socioeconomic History  . Marital status: Single    Spouse name: Not on file  . Number of children: Not on file  . Years of education: Not on file  .  Highest education level: Not on file  Occupational History  . Occupation: serves food  Tobacco Use  . Smoking status: Never Smoker  . Smokeless tobacco: Never Used  Vaping Use  . Vaping Use: Never used  Substance and Sexual Activity  . Alcohol use: Not Currently  . Drug use: Not Currently  . Sexual activity: Yes    Birth control/protection: None  Other Topics Concern  . Not on file  Social History Narrative  . Not on file   Social Determinants of Health   Financial Resource Strain:   . Difficulty of Paying Living Expenses:   Food Insecurity:   . Worried About 10/25/2019 in the Last Year:   . Programme researcher, broadcasting/film/video in the Last Year:   Transportation Needs:   . Barista (Medical):   Freight forwarder Lack of Transportation (Non-Medical):   Physical Activity:   . Days of Exercise per Week:   . Minutes of Exercise per Session:   Stress:   . Feeling of Stress :   Social Connections:   . Frequency of Communication with Friends and Family:   . Frequency of Social Gatherings with Friends and Family:   . Attends Religious Services:   . Active Member of Clubs or Organizations:   . Attends Club  or Organization Meetings:   Marland Kitchen Marital Status:   Intimate Partner Violence:   . Fear of Current or Ex-Partner:   . Emotionally Abused:   Marland Kitchen Physically Abused:   . Sexually Abused:     Family History  Problem Relation Age of Onset  . Heart disease Father   . Breast cancer Maternal Grandmother   . Ovarian cancer Neg Hx   . Colon cancer Neg Hx   . Diabetes Neg Hx     The following portions of the patient's history were reviewed and updated as appropriate: allergies, current medications, past OB history, past medical history, past surgical history, past family history, past social history, and problem list.  Review of Systems:  ROS negative except as noted above. Information obtained from patient.   OBJECTIVE:  BP 92/65   Pulse 89   Wt 117 lb (53.1 kg)   LMP 07/18/2019   BMI  21.40 kg/m   Initial Physical Exam (New OB)  GENERAL APPEARANCE: alert, well appearing, in no apparent distress  HEAD: normocephalic, atraumatic  MOUTH: mucous membranes moist, pharynx normal without lesions  THYROID: no thyromegaly or masses present  BREASTS: no masses noted, no significant tenderness, no palpable axillary nodes, no skin changes  LUNGS: clear to auscultation, no wheezes, rales or rhonchi, symmetric air entry  HEART: regular rate and rhythm, no murmurs  ABDOMEN: soft, nontender, nondistended, no abnormal masses, no epigastric pain and FHT present  EXTREMITIES: no redness or tenderness in the calves or thighs, no edema  SKIN: normal coloration and turgor, no rashes; professional tattoos present  LYMPH NODES: no adenopathy palpable  NEUROLOGIC: alert, oriented, normal speech, no focal findings or movement disorder noted  PELVIC EXAM EXTERNAL GENITALIA: normal appearing vulva with no masses, tenderness or lesions VAGINA: no abnormal discharge or lesions CERVIX: no lesions or cervical motion tenderness, Pap collected UTERUS: gravid and consistent with 11-12 weeks ADNEXA: no masses palpable and nontender OB EXAM PELVIMETRY: appears adequate  ASSESSMENT: Normal pregnancy Rh positive Declines genetic screening Prefers midwifery care Nausea and vomiting in pregnancy  PLAN: Prenatal care New OB counseling: The patient has been given an overview regarding routine prenatal care. Recommendations regarding diet, weight gain, and exercise in pregnancy were given. Prenatal testing, optional genetic testing, and ultrasound use in pregnancy were reviewed.  Benefits of Breast Feeding were discussed. The patient is encouraged to consider nursing her baby post partum. See orders

## 2019-10-24 LAB — CBC WITH DIFFERENTIAL
Basophils Absolute: 0.1 10*3/uL (ref 0.0–0.2)
Basos: 1 %
EOS (ABSOLUTE): 0.1 10*3/uL (ref 0.0–0.4)
Eos: 1 %
Hematocrit: 38.6 % (ref 34.0–46.6)
Hemoglobin: 13.1 g/dL (ref 11.1–15.9)
Immature Grans (Abs): 0 10*3/uL (ref 0.0–0.1)
Immature Granulocytes: 0 %
Lymphocytes Absolute: 2.5 10*3/uL (ref 0.7–3.1)
Lymphs: 26 %
MCH: 31.6 pg (ref 26.6–33.0)
MCHC: 33.9 g/dL (ref 31.5–35.7)
MCV: 93 fL (ref 79–97)
Monocytes Absolute: 0.6 10*3/uL (ref 0.1–0.9)
Monocytes: 7 %
Neutrophils Absolute: 6.2 10*3/uL (ref 1.4–7.0)
Neutrophils: 65 %
RBC: 4.15 x10E6/uL (ref 3.77–5.28)
RDW: 13.2 % (ref 11.7–15.4)
WBC: 9.4 10*3/uL (ref 3.4–10.8)

## 2019-10-24 LAB — URINALYSIS, ROUTINE W REFLEX MICROSCOPIC
Bilirubin, UA: NEGATIVE
Glucose, UA: NEGATIVE
Ketones, UA: NEGATIVE
Leukocytes,UA: NEGATIVE
Nitrite, UA: NEGATIVE
Protein,UA: NEGATIVE
RBC, UA: NEGATIVE
Specific Gravity, UA: 1.008 (ref 1.005–1.030)
Urobilinogen, Ur: 0.2 mg/dL (ref 0.2–1.0)
pH, UA: 7.5 (ref 5.0–7.5)

## 2019-10-24 LAB — RPR: RPR Ser Ql: NONREACTIVE

## 2019-10-24 LAB — HEPATITIS B SURFACE ANTIGEN: Hepatitis B Surface Ag: NEGATIVE

## 2019-10-24 LAB — RUBELLA SCREEN: Rubella Antibodies, IGG: 1.21 index (ref >0.99)

## 2019-10-24 LAB — ANTIBODY SCREEN: Antibody Screen: NEGATIVE

## 2019-10-24 LAB — ABO AND RH: Rh Factor: POSITIVE

## 2019-10-24 LAB — VARICELLA ZOSTER ANTIBODY, IGG: Varicella zoster IgG: 307 index (ref >165)

## 2019-10-25 LAB — URINE CULTURE: Organism ID, Bacteria: NO GROWTH

## 2019-10-26 ENCOUNTER — Telehealth: Payer: Self-pay | Admitting: Certified Nurse Midwife

## 2019-10-26 LAB — GC/CHLAMYDIA PROBE AMP
Chlamydia trachomatis, NAA: NEGATIVE
Neisseria Gonorrhoeae by PCR: NEGATIVE

## 2019-10-26 NOTE — Telephone Encounter (Signed)
Called pt couldn't leave a message saw pt had my chart moved appt

## 2019-10-28 ENCOUNTER — Ambulatory Visit (INDEPENDENT_AMBULATORY_CARE_PROVIDER_SITE_OTHER): Payer: Medicaid Other

## 2019-10-28 ENCOUNTER — Other Ambulatory Visit: Payer: Self-pay

## 2019-10-28 DIAGNOSIS — Z3687 Encounter for antenatal screening for uncertain dates: Secondary | ICD-10-CM

## 2019-10-28 DIAGNOSIS — Z3491 Encounter for supervision of normal pregnancy, unspecified, first trimester: Secondary | ICD-10-CM | POA: Diagnosis not present

## 2019-10-28 DIAGNOSIS — Z3A13 13 weeks gestation of pregnancy: Secondary | ICD-10-CM | POA: Diagnosis not present

## 2019-10-28 LAB — CYTOLOGY - PAP: Diagnosis: NEGATIVE

## 2019-11-18 ENCOUNTER — Other Ambulatory Visit: Payer: Medicaid Other

## 2019-11-18 ENCOUNTER — Ambulatory Visit (INDEPENDENT_AMBULATORY_CARE_PROVIDER_SITE_OTHER): Payer: Medicaid Other | Admitting: Certified Nurse Midwife

## 2019-11-18 ENCOUNTER — Other Ambulatory Visit: Payer: Self-pay

## 2019-11-18 ENCOUNTER — Other Ambulatory Visit: Payer: Self-pay | Admitting: Certified Nurse Midwife

## 2019-11-18 ENCOUNTER — Encounter: Payer: Self-pay | Admitting: Certified Nurse Midwife

## 2019-11-18 VITALS — BP 97/61 | HR 85 | Wt 118.0 lb

## 2019-11-18 DIAGNOSIS — Z3A17 17 weeks gestation of pregnancy: Secondary | ICD-10-CM | POA: Diagnosis not present

## 2019-11-18 DIAGNOSIS — R300 Dysuria: Secondary | ICD-10-CM | POA: Diagnosis not present

## 2019-11-18 DIAGNOSIS — Z3482 Encounter for supervision of other normal pregnancy, second trimester: Secondary | ICD-10-CM

## 2019-11-18 LAB — POCT URINALYSIS DIPSTICK OB
Bilirubin, UA: NEGATIVE
Blood, UA: NEGATIVE
Glucose, UA: NEGATIVE
Ketones, UA: NEGATIVE
Leukocytes, UA: NEGATIVE
Nitrite, UA: NEGATIVE
POC,PROTEIN,UA: NEGATIVE
Spec Grav, UA: <=1.005 — AB (ref 1.010–1.025)
Urobilinogen, UA: 0.2 E.U./dL
pH, UA: 7.5 (ref 5.0–8.0)

## 2019-11-18 NOTE — Progress Notes (Addendum)
ROB doing well, feels fluttering. Discussed musculoskeletal discomforts of pregnancy. Recommend use of belly band, tylenol, heat/cold, chiropractor as needed. Pt asks about sauna use: recommend that she not use hot tub or sauna in pregnancy. She verbalizes understanding. Pt complains of some mild burning with urination. Urine dip negative. Culture sent. Will follow up with results.  Reviewed anatomy u/s next visit. Follow up 3 wk with Marcelino Duster.   Doreene Burke, CNM

## 2019-11-18 NOTE — Patient Instructions (Signed)

## 2019-11-20 LAB — URINE CULTURE, OB REFLEX

## 2019-11-20 LAB — CULTURE, OB URINE

## 2019-12-10 ENCOUNTER — Ambulatory Visit (INDEPENDENT_AMBULATORY_CARE_PROVIDER_SITE_OTHER): Payer: Medicaid Other

## 2019-12-10 ENCOUNTER — Encounter: Payer: Medicaid Other | Admitting: Certified Nurse Midwife

## 2019-12-10 ENCOUNTER — Encounter: Payer: Self-pay | Admitting: Certified Nurse Midwife

## 2019-12-10 ENCOUNTER — Ambulatory Visit (INDEPENDENT_AMBULATORY_CARE_PROVIDER_SITE_OTHER): Payer: Medicaid Other | Admitting: Certified Nurse Midwife

## 2019-12-10 ENCOUNTER — Other Ambulatory Visit: Payer: Self-pay

## 2019-12-10 VITALS — BP 120/78 | HR 92 | Wt 122.0 lb

## 2019-12-10 DIAGNOSIS — Z3482 Encounter for supervision of other normal pregnancy, second trimester: Secondary | ICD-10-CM

## 2019-12-10 DIAGNOSIS — Z3A20 20 weeks gestation of pregnancy: Secondary | ICD-10-CM

## 2019-12-10 LAB — POCT URINALYSIS DIPSTICK OB
Bilirubin, UA: NEGATIVE
Blood, UA: NEGATIVE
Glucose, UA: NEGATIVE
Ketones, UA: NEGATIVE
Leukocytes, UA: NEGATIVE
Nitrite, UA: NEGATIVE
POC,PROTEIN,UA: NEGATIVE
Spec Grav, UA: 1.01 (ref 1.010–1.025)
Urobilinogen, UA: 0.2 E.U./dL
pH, UA: 6 (ref 5.0–8.0)

## 2019-12-10 NOTE — Patient Instructions (Signed)
Hemorrhoids Hemorrhoids are swollen veins in and around the rectum or anus. There are two types of hemorrhoids:  Internal hemorrhoids. These occur in the veins that are just inside the rectum. They may poke through to the outside and become irritated and painful.  External hemorrhoids. These occur in the veins that are outside the anus and can be felt as a painful swelling or hard lump near the anus. Most hemorrhoids do not cause serious problems, and they can be managed with home treatments such as diet and lifestyle changes. If home treatments do not help the symptoms, procedures can be done to shrink or remove the hemorrhoids. What are the causes? This condition is caused by increased pressure in the anal area. This pressure may result from various things, including:  Constipation.  Straining to have a bowel movement.  Diarrhea.  Pregnancy.  Obesity.  Sitting for long periods of time.  Heavy lifting or other activity that causes you to strain.  Anal sex.  Riding a bike for a long period of time. What are the signs or symptoms? Symptoms of this condition include:  Pain.  Anal itching or irritation.  Rectal bleeding.  Leakage of stool (feces).  Anal swelling.  One or more lumps around the anus. How is this diagnosed? This condition can often be diagnosed through a visual exam. Other exams or tests may also be done, such as:  An exam that involves feeling the rectal area with a gloved hand (digital rectal exam).  An exam of the anal canal that is done using a small tube (anoscope).  A blood test, if you have lost a significant amount of blood.  A test to look inside the colon using a flexible tube with a camera on the end (sigmoidoscopy or colonoscopy). How is this treated? This condition can usually be treated at home. However, various procedures may be done if dietary changes, lifestyle changes, and other home treatments do not help your symptoms. These  procedures can help make the hemorrhoids smaller or remove them completely. Some of these procedures involve surgery, and others do not. Common procedures include:  Rubber band ligation. Rubber bands are placed at the base of the hemorrhoids to cut off their blood supply.  Sclerotherapy. Medicine is injected into the hemorrhoids to shrink them.  Infrared coagulation. A type of light energy is used to get rid of the hemorrhoids.  Hemorrhoidectomy surgery. The hemorrhoids are surgically removed, and the veins that supply them are tied off.  Stapled hemorrhoidopexy surgery. The surgeon staples the base of the hemorrhoid to the rectal wall. Follow these instructions at home: Eating and drinking   Eat foods that have a lot of fiber in them, such as whole grains, beans, nuts, fruits, and vegetables.  Ask your health care provider about taking products that have added fiber (fiber supplements).  Reduce the amount of fat in your diet. You can do this by eating low-fat dairy products, eating less red meat, and avoiding processed foods.  Drink enough fluid to keep your urine pale yellow. Managing pain and swelling   Take warm sitz baths for 20 minutes, 3-4 times a day to ease pain and discomfort. You may do this in a bathtub or using a portable sitz bath that fits over the toilet.  If directed, apply ice to the affected area. Using ice packs between sitz baths may be helpful. ? Put ice in a plastic bag. ? Place a towel between your skin and the bag. ? Leave  the ice on for 20 minutes, 2-3 times a day. General instructions  Take over-the-counter and prescription medicines only as told by your health care provider.  Use medicated creams or suppositories as told.  Get regular exercise. Ask your health care provider how much and what kind of exercise is best for you. In general, you should do moderate exercise for at least 30 minutes on most days of the week (150 minutes each week). This can  include activities such as walking, biking, or yoga.  Go to the bathroom when you have the urge to have a bowel movement. Do not wait.  Avoid straining to have bowel movements.  Keep the anal area dry and clean. Use wet toilet paper or moist towelettes after a bowel movement.  Do not sit on the toilet for long periods of time. This increases blood pooling and pain.  Keep all follow-up visits as told by your health care provider. This is important. Contact a health care provider if you have:  Increasing pain and swelling that are not controlled by treatment or medicine.  Difficulty having a bowel movement, or you are unable to have a bowel movement.  Pain or inflammation outside the area of the hemorrhoids. Get help right away if you have:  Uncontrolled bleeding from your rectum. Summary  Hemorrhoids are swollen veins in and around the rectum or anus.  Most hemorrhoids can be managed with home treatments such as diet and lifestyle changes.  Taking warm sitz baths can help ease pain and discomfort.  In severe cases, procedures or surgery can be done to shrink or remove the hemorrhoids. This information is not intended to replace advice given to you by your health care provider. Make sure you discuss any questions you have with your health care provider. Document Revised: 07/25/2018 Document Reviewed: 07/18/2017 Elsevier Patient Education  [place].   Back Pain in Pregnancy Back pain during pregnancy is common. Back pain may be caused by several factors that are related to changes during your pregnancy. Follow these instructions at home: Managing pain, stiffness, and swelling      If directed, for sudden (acute) back pain, put ice on the painful area. ? Put ice in a plastic bag. ? Place a towel between your skin and the bag. ? Leave the ice on for 20 minutes, 2-3 times per day.  If directed, apply heat to the affected area before you exercise. Use the heat  source that your health care provider recommends, such as a moist heat pack or a heating pad. ? Place a towel between your skin and the heat source. ? Leave the heat on for 20-30 minutes. ? Remove the heat if your skin turns bright red. This is especially important if you are unable to feel pain, heat, or cold. You may have a greater risk of getting burned.  If directed, massage the affected area. Activity  Exercise as told by your health care provider. Gentle exercise is the best way to prevent or manage back pain.  Listen to your body when lifting. If lifting hurts, ask for help or bend your knees. This uses your leg muscles instead of your back muscles.  Squat down when picking up something from the floor. Do not bend over.  Only use bed rest for short periods as told by your health care provider. Bed rest should only be used for the most severe episodes of back pain. Standing, sitting, and lying down  Do not stand in one  place for long periods of time.  Use good posture when sitting. Make sure your head rests over your shoulders and is not hanging forward. Use a pillow on your lower back if necessary.  Try sleeping on your side, preferably the left side, with a pregnancy support pillow or 1-2 regular pillows between your legs. ? If you have back pain after a night's rest, your bed may be too soft. ? A firm mattress may provide more support for your back during pregnancy. General instructions  Do not wear high heels.  Eat a healthy diet. Try to gain weight within your health care provider's recommendations.  Use a maternity girdle, elastic sling, or back brace as told by your health care provider.  Take over-the-counter and prescription medicines only as told by your health care provider.  Work with a physical therapist or massage therapist to find ways to manage back pain. Acupuncture or massage therapy may be helpful.  Keep all follow-up visits as told by your health care  provider. This is important. Contact a health care provider if:  Your back pain interferes with your daily activities.  You have increasing pain in other parts of your body. Get help right away if:  You develop numbness, tingling, weakness, or problems with the use of your arms or legs.  You develop severe back pain that is not controlled with medicine.  You have a change in bowel or bladder control.  You develop shortness of breath, dizziness, or you faint.  You develop nausea, vomiting, or sweating.  You have back pain that is a rhythmic, cramping pain similar to labor pains. Labor pain is usually 1-2 minutes apart, lasts for about 1 minute, and involves a bearing down feeling or pressure in your pelvis.  You have back pain and your water breaks or you have vaginal bleeding.  You have back pain or numbness that travels down your leg.  Your back pain developed after you fell.  You develop pain on one side of your back.  You see blood in your urine.  You develop skin blisters in the area of your back pain. Summary  Back pain may be caused by several factors that are related to changes during your pregnancy.  Follow instructions as told by your health care provider for managing pain, stiffness, and swelling.  Exercise as told by your health care provider. Gentle exercise is the best way to prevent or manage back pain.  Take over-the-counter and prescription medicines only as told by your health care provider.  Keep all follow-up visits as told by your health care provider. This is important. This information is not intended to replace advice given to you by your health care provider. Make sure you discuss any questions you have with your health care provider. Document Revised: 06/17/2018 Document Reviewed: 08/14/2017 Elsevier Patient Education  [place].   Common Medications Safe in Pregnancy  Acne:      Constipation:  Benzoyl  Peroxide     Colace  Clindamycin      Dulcolax Suppository  Topica Erythromycin     Fibercon  Salicylic Acid      Metamucil         Miralax AVOID:        Senakot   Accutane    Cough:  Retin-A       Cough Drops  Tetracycline      Phenergan w/ Codeine if Rx  Minocycline      Robitussin (Plain & DM)  Antibiotics:     Crabs/Lice:  Ceclor       RID  Cephalosporins    AVOID:  E-Mycins      Kwell  Keflex  Macrobid/Macrodantin   Diarrhea:  Penicillin      Kao-Pectate  Zithromax      Imodium AD         PUSH FLUIDS AVOID:       Cipro     Fever:  Tetracycline      Tylenol (Regular or Extra  Minocycline       Strength)  Levaquin      Extra Strength-Do not          Exceed 8 tabs/24 hrs Caffeine:        '200mg'$ /day (equiv. To 1 cup of coffee or  approx. 3 12 oz sodas)         Gas: Cold/Hayfever:       Gas-X  Benadryl      Mylicon  Claritin       Phazyme  **Claritin-D        Chlor-Trimeton    Headaches:  Dimetapp      ASA-Free Excedrin  Drixoral-Non-Drowsy     Cold Compress  Mucinex (Guaifenasin)     Tylenol (Regular or Extra  Sudafed/Sudafed-12 Hour     Strength)  **Sudafed PE Pseudoephedrine   Tylenol Cold & Sinus     Vicks Vapor Rub  Zyrtec  **AVOID if Problems With Blood Pressure         Heartburn: Avoid lying down for at least 1 hour after meals  Aciphex      Maalox     Rash:  Milk of Magnesia     Benadryl    Mylanta       1% Hydrocortisone Cream  Pepcid  Pepcid Complete   Sleep Aids:  Prevacid      Ambien   Prilosec       Benadryl  Rolaids       Chamomile Tea  Tums (Limit 4/day)     Unisom         Tylenol PM         Warm milk-add vanilla or  Hemorrhoids:       Sugar for taste  Anusol/Anusol H.C.  (RX: Analapram 2.5%)  Sugar Substitutes:  Hydrocortisone OTC     Ok in moderation  Preparation H      Tucks        Vaseline lotion applied to tissue with wiping    Herpes:     Throat:  Acyclovir      Oragel  Famvir  Valtrex     Vaccines:         Flu  Shot Leg Cramps:       *Gardasil  Benadryl      Hepatitis A         Hepatitis B Nasal Spray:       Pneumovax  Saline Nasal Spray     Polio Booster         Tetanus Nausea:       Tuberculosis test or PPD  Vitamin B6 25 mg TID   AVOID:    Dramamine      *Gardasil  Emetrol       Live Poliovirus  Ginger Root 250 mg QID    MMR (measles, mumps &  High Complex Carbs @ Bedtime    rebella)  Sea Bands-Accupressure    Varicella (Chickenpox)  Unisom 1/2 tab TID     *  No known complications           If received before Pain:         Known pregnancy;   Darvocet       Resume series after  Lortab        Delivery  Percocet    Yeast:   Tramadol      Femstat  Tylenol 3      Gyne-lotrimin  Ultram       Monistat  Vicodin           MISC:         All Sunscreens           Hair Coloring/highlights          Insect Repellant's          (Including DEET)         Mystic Tans   Second Trimester of Pregnancy  The second trimester is from week 14 through week 27 (month 4 through 6). This is often the time in pregnancy that you feel your best. Often times, morning sickness has lessened or quit. You may have more energy, and you may get hungry more often. Your unborn baby is growing rapidly. At the end of the sixth month, he or she is about 9 inches long and weighs about 1 pounds. You will likely feel the baby move between 18 and 20 weeks of pregnancy. Follow these instructions at home: Medicines  Take over-the-counter and prescription medicines only as told by your doctor. Some medicines are safe and some medicines are not safe during pregnancy.  Take a prenatal vitamin that contains at least 600 micrograms (mcg) of folic acid.  If you have trouble pooping (constipation), take medicine that will make your stool soft (stool softener) if your doctor approves. Eating and drinking   Eat regular, healthy meals.  Avoid raw meat and uncooked cheese.  If you get low calcium from the food you eat, talk to your  doctor about taking a daily calcium supplement.  Avoid foods that are high in fat and sugars, such as fried and sweet foods.  If you feel sick to your stomach (nauseous) or throw up (vomit): ? Eat 4 or 5 small meals a day instead of 3 large meals. ? Try eating a few soda crackers. ? Drink liquids between meals instead of during meals.  To prevent constipation: ? Eat foods that are high in fiber, like fresh fruits and vegetables, whole grains, and beans. ? Drink enough fluids to keep your pee (urine) clear or pale yellow. Activity  Exercise only as told by your doctor. Stop exercising if you start to have cramps.  Do not exercise if it is too hot, too humid, or if you are in a place of great height (high altitude).  Avoid heavy lifting.  Wear low-heeled shoes. Sit and stand up straight.  You can continue to have sex unless your doctor tells you not to. Relieving pain and discomfort  Wear a good support bra if your breasts are tender.  Take warm water baths (sitz baths) to soothe pain or discomfort caused by hemorrhoids. Use hemorrhoid cream if your doctor approves.  Rest with your legs raised if you have leg cramps or low back pain.  If you develop puffy, bulging veins (varicose veins) in your legs: ? Wear support hose or compression stockings as told by your doctor. ? Raise (elevate) your feet for 15 minutes, 3-4 times a day. ? Limit salt in your food.  Prenatal care  Write down your questions. Take them to your prenatal visits.  Keep all your prenatal visits as told by your doctor. This is important. Safety  Wear your seat belt when driving.  Make a list of emergency phone numbers, including numbers for family, friends, the hospital, and police and fire departments. General instructions  Ask your doctor about the right foods to eat or for help finding a counselor, if you need these services.  Ask your doctor about local prenatal classes. Begin classes before month 6  of your pregnancy.  Do not use hot tubs, steam rooms, or saunas.  Do not douche or use tampons or scented sanitary pads.  Do not cross your legs for long periods of time.  Visit your dentist if you have not done so. Use a soft toothbrush to brush your teeth. Floss gently.  Avoid all smoking, herbs, and alcohol. Avoid drugs that are not approved by your doctor.  Do not use any products that contain nicotine or tobacco, such as cigarettes and e-cigarettes. If you need help quitting, ask your doctor.  Avoid cat litter boxes and soil used by cats. These carry germs that can cause birth defects in the baby and can cause a loss of your baby (miscarriage) or stillbirth. Contact a doctor if:  You have mild cramps or pressure in your lower belly.  You have pain when you pee (urinate).  You have bad smelling fluid coming from your vagina.  You continue to feel sick to your stomach (nauseous), throw up (vomit), or have watery poop (diarrhea).  You have a nagging pain in your belly area.  You feel dizzy. Get help right away if:  You have a fever.  You are leaking fluid from your vagina.  You have spotting or bleeding from your vagina.  You have severe belly cramping or pain.  You lose or gain weight rapidly.  You have trouble catching your breath and have chest pain.  You notice sudden or extreme puffiness (swelling) of your face, hands, ankles, feet, or legs.  You have not felt the baby move in over an hour.  You have severe headaches that do not go away when you take medicine.  You have trouble seeing. Summary  The second trimester is from week 14 through week 27 (months 4 through 6). This is often the time in pregnancy that you feel your best.  To take care of yourself and your unborn baby, you will need to eat healthy meals, take medicines only if your doctor tells you to do so, and do activities that are safe for you and your baby.  Call your doctor if you get sick or  if you notice anything unusual about your pregnancy. Also, call your doctor if you need help with the right food to eat, or if you want to know what activities are safe for you. This information is not intended to replace advice given to you by your health care provider. Make sure you discuss any questions you have with your health care provider. Document Revised: 06/20/2018 Document Reviewed: 04/03/2016 Elsevier Patient Education  [place].

## 2019-12-10 NOTE — Progress Notes (Signed)
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 Discussed home treatment measures including use of abdominal support. Anticipatory guidance regarding course of prenatal care. Reviewed red flag symptoms and when to call. RTC x 4 weeks for ROB or sooner if needed.   ULTRASOUND REPORT  Location: [facility] Date of Service: 12/10/2019   Indications:Anatomy Ultrasound Findings:  Singleton intrauterine pregnancy is visualized with FHR at 141 BPM. Biometrics give an (U/S) Gestational age of [redacted]w[redacted]d and an (U/S) EDD of 04/30/2020; this correlates with the clinically established Estimated Date of Delivery: 04/23/20  Fetal presentation is transverse.  EFW: 305 g ( 11 oz).  Placenta: anterior. Grade: 1 AFI: subjectively normal.  Anatomic survey is complete and normal; Gender - female.    Right Ovary is normal in appearance. Left Ovary is normal appearance. Survey of the adnexa demonstrates no adnexal masses. There is no free peritoneal fluid in the cul de sac.  Impression: 1. [redacted]w[redacted]d Viable Singleton Intrauterine pregnancy by U/S. 2. (U/S) EDD is consistent with Clinically established Estimated Date of Delivery: 04/23/20 . 3. Normal Anatomy Scan  Recommendations: 1.Clinical correlation with the patient's History and Physical Exam.

## 2020-01-04 ENCOUNTER — Other Ambulatory Visit: Payer: Self-pay

## 2020-01-04 ENCOUNTER — Encounter: Payer: Self-pay | Admitting: Certified Nurse Midwife

## 2020-01-04 ENCOUNTER — Ambulatory Visit (INDEPENDENT_AMBULATORY_CARE_PROVIDER_SITE_OTHER): Payer: Medicaid Other | Admitting: Certified Nurse Midwife

## 2020-01-04 VITALS — BP 92/62 | HR 121 | Wt 126.2 lb

## 2020-01-04 DIAGNOSIS — Z3A24 24 weeks gestation of pregnancy: Secondary | ICD-10-CM

## 2020-01-04 LAB — POCT URINALYSIS DIPSTICK OB
Bilirubin, UA: NEGATIVE
Blood, UA: NEGATIVE
Glucose, UA: NEGATIVE
Ketones, UA: NEGATIVE
Leukocytes, UA: NEGATIVE
Nitrite, UA: NEGATIVE
POC,PROTEIN,UA: NEGATIVE
Spec Grav, UA: <=1.005 — AB (ref 1.010–1.025)
Urobilinogen, UA: 0.2 E.U./dL
pH, UA: 8 (ref 5.0–8.0)

## 2020-01-04 NOTE — Progress Notes (Signed)
ROB doing well. Feels good movement. C/o allergies. Referred to med list. 28 wk labs reviewed. Follow up 4 wks for ROB with Marcelino Duster.   Doreene Burke, CNM

## 2020-01-04 NOTE — Patient Instructions (Signed)
Glucose Tolerance Test During Pregnancy Why am I having this test? The glucose tolerance test (GTT) is done to check how your body processes sugar (glucose). This is one of several tests used to diagnose diabetes that develops during pregnancy (gestational diabetes mellitus). Gestational diabetes is a temporary form of diabetes that some women develop during pregnancy. It usually occurs during the second trimester of pregnancy and goes away after delivery. Testing (screening) for gestational diabetes usually occurs between 24 and 28 weeks of pregnancy. You may have the GTT test after having a 1-hour glucose screening test if the results from that test indicate that you may have gestational diabetes. You may also have this test if:  You have a history of gestational diabetes.  You have a history of giving birth to very large babies or have experienced repeated fetal loss (stillbirth).  You have signs and symptoms of diabetes, such as: ? Changes in your vision. ? Tingling or numbness in your hands or feet. ? Changes in hunger, thirst, and urination that are not otherwise explained by your pregnancy. What is being tested? This test measures the amount of glucose in your blood at different times during a period of 3 hours. This indicates how well your body is able to process glucose. What kind of sample is taken?  Blood samples are required for this test. They are usually collected by inserting a needle into a blood vessel. How do I prepare for this test?  For 3 days before your test, eat normally. Have plenty of carbohydrate-rich foods.  Follow instructions from your health care provider about: ? Eating or drinking restrictions on the day of the test. You may be asked to not eat or drink anything other than water (fast) starting 8-10 hours before the test. ? Changing or stopping your regular medicines. Some medicines may interfere with this test. Tell a health care provider about:  All  medicines you are taking, including vitamins, herbs, eye drops, creams, and over-the-counter medicines.  Any blood disorders you have.  Any surgeries you have had.  Any medical conditions you have. What happens during the test? First, your blood glucose will be measured. This is referred to as your fasting blood glucose, since you fasted before the test. Then, you will drink a glucose solution that contains a certain amount of glucose. Your blood glucose will be measured again 1, 2, and 3 hours after drinking the solution. This test takes about 3 hours to complete. You will need to stay at the testing location during this time. During the testing period:  Do not eat or drink anything other than the glucose solution.  Do not exercise.  Do not use any products that contain nicotine or tobacco, such as cigarettes and e-cigarettes. If you need help stopping, ask your health care provider. The testing procedure may vary among health care providers and hospitals. How are the results reported? Your results will be reported as milligrams of glucose per deciliter of blood (mg/dL) or millimoles per liter (mmol/L). Your health care provider will compare your results to normal ranges that were established after testing a large group of people (reference ranges). Reference ranges may vary among labs and hospitals. For this test, common reference ranges are:  Fasting: less than 95-105 mg/dL (5.3-5.8 mmol/L).  1 hour after drinking glucose: less than 180-190 mg/dL (10.0-10.5 mmol/L).  2 hours after drinking glucose: less than 155-165 mg/dL (8.6-9.2 mmol/L).  3 hours after drinking glucose: 140-145 mg/dL (7.8-8.1 mmol/L). What do the   results mean? Results within reference ranges are considered normal, meaning that your glucose levels are well-controlled. If two or more of your blood glucose levels are high, you may be diagnosed with gestational diabetes. If only one level is high, your health care  provider may suggest repeat testing or other tests to confirm a diagnosis. Talk with your health care provider about what your results mean. Questions to ask your health care provider Ask your health care provider, or the department that is doing the test:  When will my results be ready?  How will I get my results?  What are my treatment options?  What other tests do I need?  What are my next steps? Summary  The glucose tolerance test (GTT) is one of several tests used to diagnose diabetes that develops during pregnancy (gestational diabetes mellitus). Gestational diabetes is a temporary form of diabetes that some women develop during pregnancy.  You may have the GTT test after having a 1-hour glucose screening test if the results from that test indicate that you may have gestational diabetes. You may also have this test if you have any symptoms or risk factors for gestational diabetes.  Talk with your health care provider about what your results mean. This information is not intended to replace advice given to you by your health care provider. Make sure you discuss any questions you have with your health care provider. Document Revised: 06/19/2018 Document Reviewed: 10/08/2016 Elsevier Patient Education  [place]. Round Ligament Pain  The round ligament is a cord of muscle and tissue that helps support the uterus. It can become a source of pain during pregnancy if it becomes stretched or twisted as the baby grows. The pain usually begins in the second trimester (13-28 weeks) of pregnancy, and it can come and go until the baby is delivered. It is not a serious problem, and it does not cause harm to the baby. Round ligament pain is usually a short, sharp, and pinching pain, but it can also be a dull, lingering, and aching pain. The pain is felt in the lower side of the abdomen or in the groin. It usually starts deep in the groin and moves up to the outside of the hip area. The  pain may occur when you:  Suddenly change position, such as quickly going from a sitting to standing position.  Roll over in bed.  Cough or sneeze.  Do physical activity. Follow these instructions at home:   Watch your condition for any changes.  When the pain starts, relax. Then try any of these methods to help with the pain: ? Sitting down. ? Flexing your knees up to your abdomen. ? Lying on your side with one pillow under your abdomen and another pillow between your legs. ? Sitting in a warm bath for 15-20 minutes or until the pain goes away.  Take over-the-counter and prescription medicines only as told by your health care provider.  Move slowly when you sit down or stand up.  Avoid long walks if they cause pain.  Stop or reduce your physical activities if they cause pain.  Keep all follow-up visits as told by your health care provider. This is important. Contact a health care provider if:  Your pain does not go away with treatment.  You feel pain in your back that you did not have before.  Your medicine is not helping. Get help right away if:  You have a fever or chills.  You develop uterine  contractions.  You have vaginal bleeding.  You have nausea or vomiting.  You have diarrhea.  You have pain when you urinate. Summary  Round ligament pain is felt in the lower abdomen or groin. It is usually a short, sharp, and pinching pain. It can also be a dull, lingering, and aching pain.  This pain usually begins in the second trimester (13-28 weeks). It occurs because the uterus is stretching with the growing baby, and it is not harmful to the baby.  You may notice the pain when you suddenly change position, when you cough or sneeze, or during physical activity.  Relaxing, flexing your knees to your abdomen, lying on one side, or taking a warm bath may help to get rid of the pain.  Get help from your health care provider if the pain does not go away or if you  have vaginal bleeding, nausea, vomiting, diarrhea, or painful urination. This information is not intended to replace advice given to you by your health care provider. Make sure you discuss any questions you have with your health care provider. Document Revised: 08/14/2017 Document Reviewed: 08/14/2017 Elsevier Patient Education  2020 [employer].

## 2020-01-29 ENCOUNTER — Other Ambulatory Visit: Payer: Self-pay

## 2020-01-29 DIAGNOSIS — Z3A28 28 weeks gestation of pregnancy: Secondary | ICD-10-CM

## 2020-01-29 DIAGNOSIS — Z131 Encounter for screening for diabetes mellitus: Secondary | ICD-10-CM

## 2020-01-29 DIAGNOSIS — Z130 Encounter for screening for diseases of the blood and blood-forming organs and certain disorders involving the immune mechanism: Secondary | ICD-10-CM

## 2020-01-29 DIAGNOSIS — Z3483 Encounter for supervision of other normal pregnancy, third trimester: Secondary | ICD-10-CM

## 2020-02-01 ENCOUNTER — Ambulatory Visit (INDEPENDENT_AMBULATORY_CARE_PROVIDER_SITE_OTHER): Payer: Medicaid Other | Admitting: Certified Nurse Midwife

## 2020-02-01 ENCOUNTER — Other Ambulatory Visit: Payer: Self-pay

## 2020-02-01 ENCOUNTER — Other Ambulatory Visit (INDEPENDENT_AMBULATORY_CARE_PROVIDER_SITE_OTHER): Payer: Medicaid Other

## 2020-02-01 VITALS — BP 108/63 | HR 85 | Wt 137.0 lb

## 2020-02-01 DIAGNOSIS — Z113 Encounter for screening for infections with a predominantly sexual mode of transmission: Secondary | ICD-10-CM

## 2020-02-01 DIAGNOSIS — Z130 Encounter for screening for diseases of the blood and blood-forming organs and certain disorders involving the immune mechanism: Secondary | ICD-10-CM

## 2020-02-01 DIAGNOSIS — Z3A28 28 weeks gestation of pregnancy: Secondary | ICD-10-CM

## 2020-02-01 DIAGNOSIS — Z3483 Encounter for supervision of other normal pregnancy, third trimester: Secondary | ICD-10-CM | POA: Diagnosis not present

## 2020-02-01 DIAGNOSIS — Z131 Encounter for screening for diabetes mellitus: Secondary | ICD-10-CM | POA: Diagnosis not present

## 2020-02-01 LAB — POCT URINALYSIS DIPSTICK OB
Bilirubin, UA: NEGATIVE
Blood, UA: NEGATIVE
Glucose, UA: NEGATIVE
Ketones, UA: NEGATIVE
Leukocytes, UA: NEGATIVE
Nitrite, UA: NEGATIVE
POC,PROTEIN,UA: NEGATIVE
Spec Grav, UA: <=1.005 — AB (ref 1.010–1.025)
Urobilinogen, UA: 0.2 E.U./dL
pH, UA: 5 (ref 5.0–8.0)

## 2020-02-01 NOTE — Patient Instructions (Signed)
WHAT OB PATIENTS CAN EXPECT   Confirmation of pregnancy and ultrasound ordered if medically indicated-[redacted] weeks gestation  New OB (NOB) intake with nurse and New OB (NOB) labs- [redacted] weeks gestation  New OB (NOB) physical examination with provider- 11/[redacted] weeks gestation  Flu vaccine-[redacted] weeks gestation  Anatomy scan-[redacted] weeks gestation  Glucose tolerance test, blood work to test for anemia, T-dap vaccine-[redacted] weeks gestation  Vaginal swabs/cultures-STD/Group B strep-[redacted] weeks gestation  Appointments every 4 weeks until 28 weeks  Every 2 weeks from 28 weeks until 36 weeks  Weekly visits from 36 weeks until delivery    Fetal Movement Counts Patient Name: ________________________________________________ Patient Due Date: ____________________ What is a fetal movement count?  A fetal movement count is the number of times that you feel your baby move during a certain amount of time. This may also be called a fetal kick count. A fetal movement count is recommended for every pregnant woman. You may be asked to start counting fetal movements as early as week 28 of your pregnancy. Pay attention to when your baby is most active. You may notice your baby's sleep and wake cycles. You may also notice things that make your baby move more. You should do a fetal movement count:  When your baby is normally most active.  At the same time each day. A good time to count movements is while you are resting, after having something to eat and drink. How do I count fetal movements? 1. Find a quiet, comfortable area. Sit, or lie down on your side. 2. Write down the date, the start time and stop time, and the number of movements that you felt between those two times. Take this information with you to your health care visits. 3. Write down your start time when you feel the first movement. 4. Count kicks, flutters, swishes, rolls, and jabs. You should feel at least 10 movements. 5. You may stop counting after you  have felt 10 movements, or if you have been counting for 2 hours. Write down the stop time. 6. If you do not feel 10 movements in 2 hours, contact your health care provider for further instructions. Your health care provider may want to do additional tests to assess your baby's well-being. Contact a health care provider if:  You feel fewer than 10 movements in 2 hours.  Your baby is not moving like he or she usually does. Date: ____________ Start time: ____________ Stop time: ____________ Movements: ____________ Date: ____________ Start time: ____________ Stop time: ____________ Movements: ____________ Date: ____________ Start time: ____________ Stop time: ____________ Movements: ____________ Date: ____________ Start time: ____________ Stop time: ____________ Movements: ____________ Date: ____________ Start time: ____________ Stop time: ____________ Movements: ____________ Date: ____________ Start time: ____________ Stop time: ____________ Movements: ____________ Date: ____________ Start time: ____________ Stop time: ____________ Movements: ____________ Date: ____________ Start time: ____________ Stop time: ____________ Movements: ____________ Date: ____________ Start time: ____________ Stop time: ____________ Movements: ____________ This information is not intended to replace advice given to you by your health care provider. Make sure you discuss any questions you have with your health care provider. Document Revised: 10/16/2018 Document Reviewed: 10/16/2018 Elsevier Patient Education  2020 Elsevier Inc.   Third Trimester of Pregnancy  The third trimester is from week 28 through week 40 (months 7 through 9). This trimester is when your unborn baby (fetus) is growing very fast. At the end of the ninth month, the unborn baby is about 20 inches in length. It weighs about 6-10 pounds.   Follow these instructions at home: Medicines  Take over-the-counter and prescription medicines only as told  by your doctor. Some medicines are safe and some medicines are not safe during pregnancy.  Take a prenatal vitamin that contains at least 600 micrograms (mcg) of folic acid.  If you have trouble pooping (constipation), take medicine that will make your stool soft (stool softener) if your doctor approves. Eating and drinking   Eat regular, healthy meals.  Avoid raw meat and uncooked cheese.  If you get low calcium from the food you eat, talk to your doctor about taking a daily calcium supplement.  Eat four or five small meals rather than three large meals a day.  Avoid foods that are high in fat and sugars, such as fried and sweet foods.  To prevent constipation: ? Eat foods that are high in fiber, like fresh fruits and vegetables, whole grains, and beans. ? Drink enough fluids to keep your pee (urine) clear or pale yellow. Activity  Exercise only as told by your doctor. Stop exercising if you start to have cramps.  Avoid heavy lifting, wear low heels, and sit up straight.  Do not exercise if it is too hot, too humid, or if you are in a place of great height (high altitude).  You may continue to have sex unless your doctor tells you not to. Relieving pain and discomfort  Wear a good support bra if your breasts are tender.  Take frequent breaks and rest with your legs raised if you have leg cramps or low back pain.  Take warm water baths (sitz baths) to soothe pain or discomfort caused by hemorrhoids. Use hemorrhoid cream if your doctor approves.  If you develop puffy, bulging veins (varicose veins) in your legs: ? Wear support hose or compression stockings as told by your doctor. ? Raise (elevate) your feet for 15 minutes, 3-4 times a day. ? Limit salt in your food. Safety  Wear your seat belt when driving.  Make a list of emergency phone numbers, including numbers for family, friends, the hospital, and police and fire departments. Preparing for your baby's arrival To  prepare for the arrival of your baby:  Take prenatal classes.  Practice driving to the hospital.  Visit the hospital and tour the maternity area.  Talk to your work about taking leave once the baby comes.  Pack your hospital bag.  Prepare the baby's room.  Go to your doctor visits.  Buy a rear-facing car seat. Learn how to install it in your car. General instructions  Do not use hot tubs, steam rooms, or saunas.  Do not use any products that contain nicotine or tobacco, such as cigarettes and e-cigarettes. If you need help quitting, ask your doctor.  Do not drink alcohol.  Do not douche or use tampons or scented sanitary pads.  Do not cross your legs for long periods of time.  Do not travel for long distances unless you must. Only do so if your doctor says it is okay.  Visit your dentist if you have not gone during your pregnancy. Use a soft toothbrush to brush your teeth. Be gentle when you floss.  Avoid cat litter boxes and soil used by cats. These carry germs that can cause birth defects in the baby and can cause a loss of your baby (miscarriage) or stillbirth.  Keep all your prenatal visits as told by your doctor. This is important. Contact a doctor if:  You are not sure if you are  in labor or if your water has broken.  You are dizzy.  You have mild cramps or pressure in your lower belly.  You have a nagging pain in your belly area.  You continue to feel sick to your stomach, you throw up, or you have watery poop.  You have bad smelling fluid coming from your vagina.  You have pain when you pee. Get help right away if:  You have a fever.  You are leaking fluid from your vagina.  You are spotting or bleeding from your vagina.  You have severe belly cramps or pain.  You lose or gain weight quickly.  You have trouble catching your breath and have chest pain.  You notice sudden or extreme puffiness (swelling) of your face, hands, ankles, feet, or  legs.  You have not felt the baby move in over an hour.  You have severe headaches that do not go away with medicine.  You have trouble seeing.  You are leaking, or you are having a gush of fluid, from your vagina before you are 37 weeks.  You have regular belly spasms (contractions) before you are 37 weeks. Summary  The third trimester is from week 28 through week 40 (months 7 through 9). This time is when your unborn baby is growing very fast.  Follow your doctor's advice about medicine, food, and activity.  Get ready for the arrival of your baby by taking prenatal classes, getting all the baby items ready, preparing the baby's room, and visiting your doctor to be checked.  Get help right away if you are bleeding from your vagina, or you have chest pain and trouble catching your breath, or if you have not felt your baby move in over an hour. This information is not intended to replace advice given to you by your health care provider. Make sure you discuss any questions you have with your health care provider. Document Revised: 06/19/2018 Document Reviewed: 04/03/2016 Elsevier Patient Education  2020 [employer].

## 2020-02-01 NOTE — Progress Notes (Signed)
I have seen, interviewed, and examined the patient in conjunction with the [employer] and affirm the diagnosis and management plan.   Gunnar Bulla, CNM [facility], [facility] 02/01/20 11:33 AM

## 2020-02-01 NOTE — Progress Notes (Signed)
ROB- Doing well. 28 week labs today, see orders; will contact with results. Breastfeeding education complete, see checklist. Third trimester handouts provided. Blood transfusion consent reviewed and signed. Declines TDaP. Unsure about pain management in labor, had epidural last time. Will use NFP as contraception. Anticipatory guidance regarding course of prenatal care. Reviewed red flag symptoms and when to call. RTC x 2 weeks for ROB or sooner if needed.

## 2020-02-02 LAB — RPR: RPR Ser Ql: NONREACTIVE

## 2020-02-02 LAB — CBC
Hematocrit: 35.5 % (ref 34.0–46.6)
Hemoglobin: 12 g/dL (ref 11.1–15.9)
MCH: 31.7 pg (ref 26.6–33.0)
MCHC: 33.8 g/dL (ref 31.5–35.7)
MCV: 94 fL (ref 79–97)
Platelets: 251 10*3/uL (ref 150–450)
RBC: 3.79 x10E6/uL (ref 3.77–5.28)
RDW: 12.2 % (ref 11.7–15.4)
WBC: 14.2 10*3/uL — ABNORMAL HIGH (ref 3.4–10.8)

## 2020-02-02 LAB — GLUCOSE, 1 HOUR GESTATIONAL: Gestational Diabetes Screen: 83 mg/dL (ref 65–139)

## 2020-02-16 ENCOUNTER — Ambulatory Visit (INDEPENDENT_AMBULATORY_CARE_PROVIDER_SITE_OTHER): Payer: Medicaid Other | Admitting: Certified Nurse Midwife

## 2020-02-16 ENCOUNTER — Encounter: Payer: Self-pay | Admitting: Certified Nurse Midwife

## 2020-02-16 ENCOUNTER — Other Ambulatory Visit: Payer: Self-pay

## 2020-02-16 VITALS — BP 115/56 | HR 84 | Wt 138.3 lb

## 2020-02-16 DIAGNOSIS — Z3A30 30 weeks gestation of pregnancy: Secondary | ICD-10-CM

## 2020-02-16 LAB — POCT URINALYSIS DIPSTICK OB
Bilirubin, UA: NEGATIVE
Blood, UA: NEGATIVE
Glucose, UA: NEGATIVE
Ketones, UA: NEGATIVE
Leukocytes, UA: NEGATIVE
Nitrite, UA: NEGATIVE
POC,PROTEIN,UA: NEGATIVE
Spec Grav, UA: 1.02 (ref 1.010–1.025)
Urobilinogen, UA: 0.2 E.U./dL
pH, UA: 5 (ref 5.0–8.0)

## 2020-02-16 NOTE — Progress Notes (Signed)
ROB doing well. Feels good fetal movement. C/o round ligament pain. Self help measures reviewed. RSB-discussed. Information for class given. See check list for topics reviewed. Follow up 2 wk or prn with Marcelino Duster.   Doreene Burke, CNM

## 2020-02-16 NOTE — Patient Instructions (Signed)
Braxton Hicks Contractions °Contractions of the uterus can occur throughout pregnancy, but they are not always a sign that you are in labor. You may have practice contractions called Braxton Hicks contractions. These false labor contractions are sometimes confused with true labor. °What are Braxton Hicks contractions? °Braxton Hicks contractions are tightening movements that occur in the muscles of the uterus before labor. Unlike true labor contractions, these contractions do not result in opening (dilation) and thinning of the cervix. Toward the end of pregnancy (32-34 weeks), Braxton Hicks contractions can happen more often and may become stronger. These contractions are sometimes difficult to tell apart from true labor because they can be very uncomfortable. You should not feel embarrassed if you go to the hospital with false labor. °Sometimes, the only way to tell if you are in true labor is for your health care provider to look for changes in the cervix. The health care provider will do a physical exam and may monitor your contractions. If you are not in true labor, the exam should show that your cervix is not dilating and your water has not broken. °If there are no other health problems associated with your pregnancy, it is completely safe for you to be sent home with false labor. You may continue to have Braxton Hicks contractions until you go into true labor. °How to tell the difference between true labor and false labor °True labor °· Contractions last 30-70 seconds. °· Contractions become very regular. °· Discomfort is usually felt in the top of the uterus, and it spreads to the lower abdomen and low back. °· Contractions do not go away with walking. °· Contractions usually become more intense and increase in frequency. °· The cervix dilates and gets thinner. °False labor °· Contractions are usually shorter and not as strong as true labor contractions. °· Contractions are usually irregular. °· Contractions  are often felt in the front of the lower abdomen and in the groin. °· Contractions may go away when you walk around or change positions while lying down. °· Contractions get weaker and are shorter-lasting as time goes on. °· The cervix usually does not dilate or become thin. °Follow these instructions at home: ° °· Take over-the-counter and prescription medicines only as told by your health care provider. °· Keep up with your usual exercises and follow other instructions from your health care provider. °· Eat and drink lightly if you think you are going into labor. °· If Braxton Hicks contractions are making you uncomfortable: °? Change your position from lying down or resting to walking, or change from walking to resting. °? Sit and rest in a tub of warm water. °? Drink enough fluid to keep your urine pale yellow. Dehydration may cause these contractions. °? Do slow and deep breathing several times an hour. °· Keep all follow-up prenatal visits as told by your health care provider. This is important. °Contact a health care provider if: °· You have a fever. °· You have continuous pain in your abdomen. °Get help right away if: °· Your contractions become stronger, more regular, and closer together. °· You have fluid leaking or gushing from your vagina. °· You pass blood-tinged mucus (bloody show). °· You have bleeding from your vagina. °· You have low back pain that you never had before. °· You feel your baby’s head pushing down and causing pelvic pressure. °· Your baby is not moving inside you as much as it used to. °Summary °· Contractions that occur before labor are   called Braxton Hicks contractions, false labor, or practice contractions. °· Braxton Hicks contractions are usually shorter, weaker, farther apart, and less regular than true labor contractions. True labor contractions usually become progressively stronger and regular, and they become more frequent. °· Manage discomfort from Braxton Hicks contractions  by changing position, resting in a warm bath, drinking plenty of water, or practicing deep breathing. °This information is not intended to replace advice given to you by your health care provider. Make sure you discuss any questions you have with your health care provider. °Document Revised: 02/08/2017 Document Reviewed: 07/12/2016 °Elsevier Patient Education © 2020 Elsevier Inc. ° °

## 2020-02-26 ENCOUNTER — Encounter: Payer: Self-pay | Admitting: Certified Nurse Midwife

## 2020-02-26 ENCOUNTER — Other Ambulatory Visit: Payer: Self-pay

## 2020-02-26 ENCOUNTER — Ambulatory Visit (INDEPENDENT_AMBULATORY_CARE_PROVIDER_SITE_OTHER): Payer: Medicaid Other | Admitting: Certified Nurse Midwife

## 2020-02-26 VITALS — BP 98/72 | HR 102 | Wt 138.6 lb

## 2020-02-26 DIAGNOSIS — Z3A31 31 weeks gestation of pregnancy: Secondary | ICD-10-CM

## 2020-02-26 DIAGNOSIS — Z3483 Encounter for supervision of other normal pregnancy, third trimester: Secondary | ICD-10-CM

## 2020-02-26 LAB — POCT URINALYSIS DIPSTICK OB
Bilirubin, UA: NEGATIVE
Blood, UA: NEGATIVE
Glucose, UA: NEGATIVE
Ketones, UA: NEGATIVE
Leukocytes, UA: NEGATIVE
Nitrite, UA: NEGATIVE
POC,PROTEIN,UA: NEGATIVE
Spec Grav, UA: 1.01 (ref 1.010–1.025)
Urobilinogen, UA: 0.2 E.U./dL
pH, UA: 8 (ref 5.0–8.0)

## 2020-02-26 NOTE — Progress Notes (Signed)
I have seen, interviewed, and examined the patient in conjunction with the [employer] and affirm the diagnosis and management plan.   Gunnar Bulla, CNM [facility], [facility] 02/26/20 10:25 AM

## 2020-02-26 NOTE — Progress Notes (Signed)
ROB- doing well, accompanied with daughter. Addressed questions and concerns. Expressed some pulling and lower back discomfort while at work, encouraged wearing a belly support. Anticipatory guidance regarding course of prenatal care. Reviewed red flags and when to call. RTC x 2 weeks with Pattricia Boss or sooner if needed.  Juliann Pares, Student-MidWife [employer] 02/26/20 10:11 AM

## 2020-02-26 NOTE — Progress Notes (Signed)
Pt present for routine prenatal visit.  

## 2020-02-26 NOTE — Patient Instructions (Signed)
Braxton Hicks Contractions Contractions of the uterus can occur throughout pregnancy, but they are not always a sign that you are in labor. You may have practice contractions called Braxton Hicks contractions. These false labor contractions are sometimes confused with true labor. What are Braxton Hicks contractions? Braxton Hicks contractions are tightening movements that occur in the muscles of the uterus before labor. Unlike true labor contractions, these contractions do not result in opening (dilation) and thinning of the cervix. Toward the end of pregnancy (32-34 weeks), Braxton Hicks contractions can happen more often and may become stronger. These contractions are sometimes difficult to tell apart from true labor because they can be very uncomfortable. You should not feel embarrassed if you go to the hospital with false labor. Sometimes, the only way to tell if you are in true labor is for your health care provider to look for changes in the cervix. The health care provider will do a physical exam and may monitor your contractions. If you are not in true labor, the exam should show that your cervix is not dilating and your water has not broken. If there are no other health problems associated with your pregnancy, it is completely safe for you to be sent home with false labor. You may continue to have Braxton Hicks contractions until you go into true labor. How to tell the difference between true labor and false labor True labor  Contractions last 30-70 seconds.  Contractions become very regular.  Discomfort is usually felt in the top of the uterus, and it spreads to the lower abdomen and low back.  Contractions do not go away with walking.  Contractions usually become more intense and increase in frequency.  The cervix dilates and gets thinner. False labor  Contractions are usually shorter and not as strong as true labor contractions.  Contractions are usually irregular.  Contractions  are often felt in the front of the lower abdomen and in the groin.  Contractions may go away when you walk around or change positions while lying down.  Contractions get weaker and are shorter-lasting as time goes on.  The cervix usually does not dilate or become thin. Follow these instructions at home:   Take over-the-counter and prescription medicines only as told by your health care provider.  Keep up with your usual exercises and follow other instructions from your health care provider.  Eat and drink lightly if you think you are going into labor.  If Braxton Hicks contractions are making you uncomfortable: ? Change your position from lying down or resting to walking, or change from walking to resting. ? Sit and rest in a tub of warm water. ? Drink enough fluid to keep your urine pale yellow. Dehydration may cause these contractions. ? Do slow and deep breathing several times an hour.  Keep all follow-up prenatal visits as told by your health care provider. This is important. Contact a health care provider if:  You have a fever.  You have continuous pain in your abdomen. Get help right away if:  Your contractions become stronger, more regular, and closer together.  You have fluid leaking or gushing from your vagina.  You pass blood-tinged mucus (bloody show).  You have bleeding from your vagina.  You have low back pain that you never had before.  You feel your baby's head pushing down and causing pelvic pressure.  Your baby is not moving inside you as much as it used to. Summary  Contractions that occur before labor are   called Braxton Hicks contractions, false labor, or practice contractions.  Braxton Hicks contractions are usually shorter, weaker, farther apart, and less regular than true labor contractions. True labor contractions usually become progressively stronger and regular, and they become more frequent.  Manage discomfort from Braxton Hicks contractions  by changing position, resting in a warm bath, drinking plenty of water, or practicing deep breathing. This information is not intended to replace advice given to you by your health care provider. Make sure you discuss any questions you have with your health care provider. Document Revised: 02/08/2017 Document Reviewed: 07/12/2016 Elsevier Patient Education  2020 [employer]     Third Trimester of Pregnancy  The third trimester is from week 28 through week 40 (months 7 through 9). This trimester is when your unborn baby (fetus) is growing very fast. At the end of the ninth month, the unborn baby is about 20 inches in length. It weighs about 6-10 pounds. Follow these instructions at home: Medicines  Take over-the-counter and prescription medicines only as told by your doctor. Some medicines are safe and some medicines are not safe during pregnancy.  Take a prenatal vitamin that contains at least 600 micrograms (mcg) of folic acid.  If you have trouble pooping (constipation), take medicine that will make your stool soft (stool softener) if your doctor approves. Eating and drinking   Eat regular, healthy meals.  Avoid raw meat and uncooked cheese.  If you get low calcium from the food you eat, talk to your doctor about taking a daily calcium supplement.  Eat four or five small meals rather than three large meals a day.  Avoid foods that are high in fat and sugars, such as fried and sweet foods.  To prevent constipation: ? Eat foods that are high in fiber, like fresh fruits and vegetables, whole grains, and beans. ? Drink enough fluids to keep your pee (urine) clear or pale yellow. Activity  Exercise only as told by your doctor. Stop exercising if you start to have cramps.  Avoid heavy lifting, wear low heels, and sit up straight.  Do not exercise if it is too hot, too humid, or if you are in a place of great height (high altitude).  You may continue to have sex unless your  doctor tells you not to. Relieving pain and discomfort  Wear a good support bra if your breasts are tender.  Take frequent breaks and rest with your legs raised if you have leg cramps or low back pain.  Take warm water baths (sitz baths) to soothe pain or discomfort caused by hemorrhoids. Use hemorrhoid cream if your doctor approves.  If you develop puffy, bulging veins (varicose veins) in your legs: ? Wear support hose or compression stockings as told by your doctor. ? Raise (elevate) your feet for 15 minutes, 3-4 times a day. ? Limit salt in your food. Safety  Wear your seat belt when driving.  Make a list of emergency phone numbers, including numbers for family, friends, the hospital, and police and fire departments. Preparing for your baby's arrival To prepare for the arrival of your baby:  Take prenatal classes.  Practice driving to the hospital.  Visit the hospital and tour the maternity area.  Talk to your work about taking leave once the baby comes.  Pack your hospital bag.  Prepare the baby's room.  Go to your doctor visits.  Buy a rear-facing car seat. Learn how to install it in your car. General instructions  Do not   use hot tubs, steam rooms, or saunas.  Do not use any products that contain nicotine or tobacco, such as cigarettes and e-cigarettes. If you need help quitting, ask your doctor.  Do not drink alcohol.  Do not douche or use tampons or scented sanitary pads.  Do not cross your legs for long periods of time.  Do not travel for long distances unless you must. Only do so if your doctor says it is okay.  Visit your dentist if you have not gone during your pregnancy. Use a soft toothbrush to brush your teeth. Be gentle when you floss.  Avoid cat litter boxes and soil used by cats. These carry germs that can cause birth defects in the baby and can cause a loss of your baby (miscarriage) or stillbirth.  Keep all your prenatal visits as told by your  doctor. This is important. Contact a doctor if:  You are not sure if you are in labor or if your water has broken.  You are dizzy.  You have mild cramps or pressure in your lower belly.  You have a nagging pain in your belly area.  You continue to feel sick to your stomach, you throw up, or you have watery poop.  You have bad smelling fluid coming from your vagina.  You have pain when you pee. Get help right away if:  You have a fever.  You are leaking fluid from your vagina.  You are spotting or bleeding from your vagina.  You have severe belly cramps or pain.  You lose or gain weight quickly.  You have trouble catching your breath and have chest pain.  You notice sudden or extreme puffiness (swelling) of your face, hands, ankles, feet, or legs.  You have not felt the baby move in over an hour.  You have severe headaches that do not go away with medicine.  You have trouble seeing.  You are leaking, or you are having a gush of fluid, from your vagina before you are 37 weeks.  You have regular belly spasms (contractions) before you are 37 weeks. Summary  The third trimester is from week 28 through week 40 (months 7 through 9). This time is when your unborn baby is growing very fast.  Follow your doctor's advice about medicine, food, and activity.  Get ready for the arrival of your baby by taking prenatal classes, getting all the baby items ready, preparing the baby's room, and visiting your doctor to be checked.  Get help right away if you are bleeding from your vagina, or you have chest pain and trouble catching your breath, or if you have not felt your baby move in over an hour. This information is not intended to replace advice given to you by your health care provider. Make sure you discuss any questions you have with your health care provider. Document Revised: 06/19/2018 Document Reviewed: 04/03/2016 Elsevier Patient Education  2020 [employer]  

## 2020-03-09 ENCOUNTER — Encounter: Payer: Self-pay | Admitting: Certified Nurse Midwife

## 2020-03-09 ENCOUNTER — Other Ambulatory Visit: Payer: Self-pay

## 2020-03-09 ENCOUNTER — Ambulatory Visit (INDEPENDENT_AMBULATORY_CARE_PROVIDER_SITE_OTHER): Payer: Medicaid Other | Admitting: Certified Nurse Midwife

## 2020-03-09 VITALS — BP 101/68 | HR 106 | Wt 140.6 lb

## 2020-03-09 DIAGNOSIS — Z3A33 33 weeks gestation of pregnancy: Secondary | ICD-10-CM

## 2020-03-09 DIAGNOSIS — Z3483 Encounter for supervision of other normal pregnancy, third trimester: Secondary | ICD-10-CM

## 2020-03-09 LAB — POCT URINALYSIS DIPSTICK OB
Bilirubin, UA: NEGATIVE
Blood, UA: NEGATIVE
Glucose, UA: NEGATIVE
Ketones, UA: NEGATIVE
Leukocytes, UA: NEGATIVE
Nitrite, UA: NEGATIVE
POC,PROTEIN,UA: NEGATIVE
Spec Grav, UA: <=1.005 — AB (ref 1.010–1.025)
Urobilinogen, UA: 0.2 E.U./dL
pH, UA: 7 (ref 5.0–8.0)

## 2020-03-09 NOTE — Progress Notes (Signed)
ROB doing well. Feels good fetal movement. State she feels like she over did it a your the other day ( works at [employer]). Was having cramping and saw spots after work that resolved with rest. Discussed PTL signs and symptoms.  Pre E signs and symptoms reviewed. Reviewed gbs test @36  wks. All questions answered. Work note given for restrictions on lifting and standing.  Follow up 2 wk with or prn.   Marcelino Duster, CNM

## 2020-03-09 NOTE — Patient Instructions (Signed)

## 2020-03-09 NOTE — Progress Notes (Signed)
Pt present for routine prenatal visit. No complaints. °

## 2020-03-12 NOTE — L&D Delivery Note (Signed)
Delivery Note  2012 Called in room to see patient, reports pelvic pressure and the urge to push. SVE: 10/100/+1, vertex. Effective coached maternal pushing efforts in various positions.   Spontaneous vaginal birth of liveborn female infant at 2039 over intact perineum in left occiput anterior position. Infant immediately to maternal abdomen, delayed cord clamping, skin to skin, and three (3) vessel cord. APGARs: 8, 9. Weight pending. Receiving nurse present at bedside for birth.   Pitocin bolus infusing, see MAR. Delivery of intact placenta at 2055. Uterus firm. Rubra small. No lacerations. Vault check completed under epidural anesthesia. QBL: 320 ml. Counts correct x 2.   Initiate routine postpartum care and orders. Mom to postpartum.  Baby to Couplet care / Skin to Skin.  FOB present at bedside and overjoyed with the birth of "Maxine".    Serafina Royals, CNM [facility], [facility] 04/30/2020, 9:34 PM

## 2020-03-18 ENCOUNTER — Telehealth: Payer: Self-pay

## 2020-03-18 ENCOUNTER — Ambulatory Visit: Admit: 2020-03-18 | Discharge status: Absent without leave | Payer: Medicaid Other

## 2020-03-18 NOTE — Telephone Encounter (Signed)
Pt called in and stated that she hasn't been feeling good the pt is experiencing, cough and  body aches. The pt took a at home Covid test, it came back negative. The pt stated that she called her PCP and that told her to call us the pt is requesting something sent in to the pharmacy or a call back. Please advise

## 2020-03-18 NOTE — Telephone Encounter (Signed)
Please contact patient. Encourage PCR test. Review safe pregnancy medications. Thanks, JML

## 2020-03-18 NOTE — Telephone Encounter (Signed)
Spoh patient- reviewed labor precautions. Also advised her to get COVID testing. Per Serafina Royals CNM patient may go to [address] [place] for testing. Also she can regisetr at [email or web address]. Patient expressed understanding.

## 2020-03-24 ENCOUNTER — Telehealth: Payer: Self-pay

## 2020-03-24 NOTE — Telephone Encounter (Signed)
mychart message sent to patient re: no visitors 

## 2020-03-25 ENCOUNTER — Ambulatory Visit (INDEPENDENT_AMBULATORY_CARE_PROVIDER_SITE_OTHER): Payer: Medicaid Other | Admitting: Certified Nurse Midwife

## 2020-03-25 ENCOUNTER — Other Ambulatory Visit: Payer: Self-pay

## 2020-03-25 ENCOUNTER — Encounter: Payer: Self-pay | Admitting: Certified Nurse Midwife

## 2020-03-25 VITALS — BP 99/68 | HR 97 | Wt 142.2 lb

## 2020-03-25 DIAGNOSIS — Z3A35 35 weeks gestation of pregnancy: Secondary | ICD-10-CM

## 2020-03-25 DIAGNOSIS — R0981 Nasal congestion: Secondary | ICD-10-CM

## 2020-03-25 DIAGNOSIS — R062 Wheezing: Secondary | ICD-10-CM

## 2020-03-25 DIAGNOSIS — Z3483 Encounter for supervision of other normal pregnancy, third trimester: Secondary | ICD-10-CM

## 2020-03-25 LAB — POCT URINALYSIS DIPSTICK OB
Bilirubin, UA: NEGATIVE
Blood, UA: NEGATIVE
Glucose, UA: NEGATIVE
Ketones, UA: NEGATIVE
Leukocytes, UA: NEGATIVE
Nitrite, UA: NEGATIVE
POC,PROTEIN,UA: NEGATIVE
Spec Grav, UA: 1.015 (ref 1.010–1.025)
Urobilinogen, UA: 0.2 E.U./dL
pH, UA: 6.5 (ref 5.0–8.0)

## 2020-03-25 NOTE — Patient Instructions (Addendum)
Ipratropium; Albuterol Inhalation Solution What is this medicine? IPRATROPIUM: ALBUTEROL (i pra TROE pee um; al Gaspar BiddingBYOO ter ole) is a combination of 2 drugs to treat COPD. Ipratropium is a bronchodilator that helps keep airways open. Albuterol decreases inflammation in the lungs. Do not use this drug combination for acute asthma attacks or bronchospasm. This medicine may be used for other purposes; ask your health care provider or pharmacist if you have questions. COMMON BRAND NAME(S): DuoNeb What should I tell my health care provider before I take this medicine? They need to know if you have any of the following conditions:  heart disease  high blood pressure  irregular heartbeat or rhythm  an unusual or allergic reaction to ipratropium, albuterol, atropine, other medicines, foods, dyes, or preservatives  pregnant or trying to get pregnant  breast-feeding How should I use this medicine? This medicine is used in a nebulizer. Nebulizers make a liquid into an aerosol that you breathe in through your mouth or your mouth and nose into your lungs. You will be taught how to use your nebulizer. Follow the directions on your prescription label. Take your medicine at regular intervals. Do not use more often than directed. Talk to your pediatrician regarding the use of this medicine in children. Special care may be needed. Overdosage: If you think you have taken too much of this medicine contact a poison control center or emergency room at once. NOTE: This medicine is only for you. Do not share this medicine with others. What if I miss a dose? If you miss a dose, take it as soon as you can. If it is almost time for your next dose, take only that dose. Do not take double or extra doses. What may interact with this medicine?  atropine  beta-blockers like metoprolol and propranolol  certain medicines for allergy, cough, and cold  certain medicines for bladder problems like oxybutynin,  tolterodine  certain medicines for depression, anxiety, or psychotic disturbances  certain medicines for stomach problems like dicyclomine, hyoscyamine  certain medicines for travel sickness like scopolamine  certain medicines for Parkinson's disease like benztropine, trihexyphenidyl  diuretics  MAOIs like Carbex, Eldepryl, Marplan, Nardil, and Parnate  some medicines for irregular heartbeat  stimulant medicines for attention disorders, weight loss, or staying awake This list may not describe all possible interactions. Give your health care provider a list of all the medicines, herbs, non-prescription drugs, or dietary supplements you use. Also tell them if you smoke, drink alcohol, or use illegal drugs. Some items may interact with your medicine. What should I watch for while using this medicine? Tell your doctor or healthcare professional if your symptoms do not start to get better or if they get worse. If your breathing gets worse while you are using this medicine, call your doctor right away. Do not stop using your medicine unless your doctor tells you to. Your mouth may get dry. Chewing sugarless gum or sucking hard candy, and drinking plenty of water may help. Contact your doctor if the problem does not go away or is severe. You may get dizzy or have blurred vision. Do not drive, use machinery, or do anything that needs mental alertness until you know how this medicine affects you. Do not stand or sit up quickly, especially if you are an older patient. This reduces the risk of dizzy or fainting spells. What side effects may I notice from receiving this medicine? Side effects that you should report to your doctor or health care professional as  soon as possible:  allergic reactions like skin rash, itching or hives, swelling of the face, lips, or tongue  breathing problems  changes in vision  heartbeat rhythm changes (trouble breathing; chest pain; dizziness; fast, irregular  heartbeat; feeling faint or lightheaded, falls)  high blood pressure  restlessness  signs and symptoms of low potassium like muscle cramps; muscle pain; chest pain; dizziness; feeling faint or lightheaded, falls; palpitations; breathing problems; or fast, irregular heartbeat  trouble passing urine or change in the amount of urine  trouble sleeping  vomiting Side effects that usually do not require medical attention (report to your doctor or health care professional if they continue or are bothersome):  changes in taste  cough  dry mouth  headache  nasal congestion (like runny or stuffy nose)  sore throat  tremors This list may not describe all possible side effects. Call your doctor for medical advice about side effects. You may report side effects to FDA at 1-800-FDA-1088. Where should I keep my medicine? Keep out of the reach of children. Store at a room temperature 2 and 30 degrees C (36 to 86 degrees F). Protect from light. Store this medicine in the protective pouch until ready to use. Throw away any unused medicine after the expiration date. NOTE: This sheet is a summary. It may not cover all possible information. If you have questions about this medicine, talk to your doctor, pharmacist, or health care provider.  2021 Elsevier/Gold Standard (2019-01-14 21:05:48)   Cefdinir Capsules What is this medicine? CEFDINIR (SEF di ner) is a cephalosporin antibiotic. It treats some infections caused by bacteria. It will not work for colds, the flu, or other viruses. This medicine may be used for other purposes; ask your health care provider or pharmacist if you have questions. COMMON BRAND NAME(S): Omnicef What should I tell my health care provider before I take this medicine? They need to know if you have any of these conditions:  bleeding disorder  kidney disease  stomach or intestine problems such as colitis  an unusual or allergic reaction to cefdinir, other  penicillin or cephalosporin antibiotics, other medicines, foods, dyes, or preservatives  pregnant or trying to get pregnant  breast-feeding How should I use this medicine? Take this drug by mouth. Take it as directed on the prescription label at the same time every day. You can take it with or without food. If it upsets your stomach, take it with food. Take all of this drug unless your health care provider tells you to stop it early. Keep taking it even if you think you are better. Take products with aluminum, magnesium, or iron in them at a different time of day than this drug. Take this drug 2 hours BEFORE or 2 hours AFTER these products. Talk to your health care provider about the use of this drug in children. While it may be prescribed for selected conditions, precautions do apply. Overdosage: If you think you have taken too much of this medicine contact a poison control center or emergency room at once. NOTE: This medicine is only for you. Do not share this medicine with others. What if I miss a dose? If you miss a dose, take it as soon as you can. If it is almost time for your next dose, take only that dose. Do not take double or extra doses. What may interact with this medicine?  antacids that contain aluminum or magnesium  iron supplements  other antibiotics  probenecid This list may not describe  all possible interactions. Give your health care provider a list of all the medicines, herbs, non-prescription drugs, or dietary supplements you use. Also tell them if you smoke, drink alcohol, or use illegal drugs. Some items may interact with your medicine. What should I watch for while using this medicine? Tell your health care provider if your symptoms do not start to get better or if they get worse. Do not treat diarrhea with over the counter products. Contact your health care provider if you have diarrhea that lasts more than 2 days or if it is severe and watery. This medicine may  cause serious skin reactions. They can happen weeks to months after starting the medicine. Contact your health care provider right away if you notice fevers or flu-like symptoms with a rash. The rash may be red or purple and then turn into blisters or peeling of the skin. Or, you might notice a red rash with swelling of the face, lips or lymph nodes in your neck or under your arms. If you have diabetes, you may get a false-positive result for sugar in your urine. Check with your health care provider. What side effects may I notice from receiving this medicine? Side effects that you should report to your doctor or health care provider as soon as possible:  allergic reactions (skin rash, itching or hives; swelling of the face, lips, or tongue)  bloody or watery diarrhea  fever  kidney injury (trouble passing urine or change in the amount of urine)  redness, blistering, peeling, or loosening of the skin, including inside the mouth  seizures  trouble breathing  unusual bruising or bleeding  unusually weak or tired Side effects that usually do not require medical attention (report to your doctor or health care provider if they continue or are bothersome):  diarrhea  dry mouth  headache  loss of appetite  nausea, vomiting  stomach pain  unusual vaginal discharge, itching, or odor This list may not describe all possible side effects. Call your doctor for medical advice about side effects. You may report side effects to FDA at 1-800-FDA-1088. Where should I keep my medicine? Keep out of the reach of children and pets. Store at room temperature between 20 and 25 degrees C (68 and 77 degrees F). Throw away any unused drug after the expiration date. NOTE: This sheet is a summary. It may not cover all possible information. If you have questions about this medicine, talk to your doctor, pharmacist, or health care provider.  2021 Elsevier/Gold Standard (2018-12-26 10:20:34)   Fetal  Movement Counts Patient Name: ________________________________________________ Patient Due Date: ____________________  What is a fetal movement count? A fetal movement count is the number of times that you feel your baby move during a certain amount of time. This may also be called a fetal kick count. A fetal movement count is recommended for every pregnant woman. You may be asked to start counting fetal movements as early as week 28 of your pregnancy. Pay attention to when your baby is most active. You may notice your baby's sleep and wake cycles. You may also notice things that make your baby move more. You should do a fetal movement count:  When your baby is normally most active.  At the same time each day. A good time to count movements is while you are resting, after having something to eat and drink. How do I count fetal movements? 1. Find a quiet, comfortable area. Sit, or lie down on your side. 2.  Write down the date, the start time and stop time, and the number of movements that you felt between those two times. Take this information with you to your health care visits. 3. Write down your start time when you feel the first movement. 4. Count kicks, flutters, swishes, rolls, and jabs. You should feel at least 10 movements. 5. You may stop counting after you have felt 10 movements, or if you have been counting for 2 hours. Write down the stop time. 6. If you do not feel 10 movements in 2 hours, contact your health care provider for further instructions. Your health care provider may want to do additional tests to assess your baby's well-being. Contact a health care provider if:  You feel fewer than 10 movements in 2 hours.  Your baby is not moving like he or she usually does. Date: ____________ Start time: ____________ Stop time: ____________ Movements: ____________ Date: ____________ Start time: ____________ Stop time: ____________ Movements: ____________ Date: ____________ Start  time: ____________ Stop time: ____________ Movements: ____________ Date: ____________ Start time: ____________ Stop time: ____________ Movements: ____________ Date: ____________ Start time: ____________ Stop time: ____________ Movements: ____________ Date: ____________ Start time: ____________ Stop time: ____________ Movements: ____________ Date: ____________ Start time: ____________ Stop time: ____________ Movements: ____________ Date: ____________ Start time: ____________ Stop time: ____________ Movements: ____________ Date: ____________ Start time: ____________ Stop time: ____________ Movements: ____________ This information is not intended to replace advice given to you by your health care provider. Make sure you discuss any questions you have with your health care provider. Document Revised: 10/16/2018 Document Reviewed: 10/16/2018 Elsevier Patient Education  2021 [employer].

## 2020-03-26 MED ORDER — CEFDINIR 300 MG PO CAPS: 300.0000 mg | ORAL_CAPSULE | Freq: Two times a day (BID) | ORAL | 0 refills | Status: AC

## 2020-03-26 MED ORDER — ALBUTEROL SULFATE HFA 108 (90 BASE) MCG/ACT IN AERS: 2.0000 | INHALATION_SPRAY | Freq: Four times a day (QID) | RESPIRATORY_TRACT | 0 refills | Status: AC | PRN

## 2020-03-26 NOTE — Progress Notes (Signed)
ROB-Reports congestion for the last two (2) weeks and occasional productive cough with green phlegm, not relieved by home treatment. Notes three (3) separate negative COVID tests (one at home, two community). Upon assessments right lower lobes positive for expiratory wheeze and mild crackles. Rx Omnicef and albuterol, see orders. Pre-labor checklist, birth affirmations, and herb prep guide given. Anticipatory guidance regarding course of prenatal care. Reviewed red flag symptoms and when to call. RTC x 1 week for 36 week cultures and ROB or sooner if needed.

## 2020-04-04 ENCOUNTER — Encounter: Payer: Self-pay | Admitting: Certified Nurse Midwife

## 2020-04-04 ENCOUNTER — Other Ambulatory Visit: Payer: Self-pay

## 2020-04-04 ENCOUNTER — Ambulatory Visit (INDEPENDENT_AMBULATORY_CARE_PROVIDER_SITE_OTHER): Payer: Medicaid Other | Admitting: Certified Nurse Midwife

## 2020-04-04 VITALS — BP 99/63 | HR 101 | Wt 146.3 lb

## 2020-04-04 DIAGNOSIS — Z3A37 37 weeks gestation of pregnancy: Secondary | ICD-10-CM

## 2020-04-04 LAB — POCT URINALYSIS DIPSTICK OB
Bilirubin, UA: NEGATIVE
Blood, UA: NEGATIVE
Glucose, UA: NEGATIVE
Ketones, UA: NEGATIVE
Leukocytes, UA: NEGATIVE
Nitrite, UA: NEGATIVE
POC,PROTEIN,UA: NEGATIVE
Spec Grav, UA: <=1.005 — AB (ref 1.010–1.025)
Urobilinogen, UA: 0.2 E.U./dL
pH, UA: 6.5 (ref 5.0–8.0)

## 2020-04-04 LAB — OB RESULTS CONSOLE GC/CHLAMYDIA: Gonorrhea: NEGATIVE

## 2020-04-04 NOTE — Progress Notes (Signed)
ROB doing well. Feels good movement. GBS and cultures collected today. Declines SVE, Leopold's suggest vertex position. Labor precautions reviewed. Pt has concern about hemorrhoids has hx with last pregnancy, state she feels like her bottom is swollen. No hemorrhoids noted, skin tag from previous hemorrhoid visible. Self help measures reviewed. Discussed stool softer, and medication if needed prn. She verbalizes and agrees. Follow up 1 wk with Marcelino Duster.   Doreene Burke, CNM

## 2020-04-04 NOTE — Patient Instructions (Signed)

## 2020-04-06 LAB — GC/CHLAMYDIA PROBE AMP
Chlamydia trachomatis, NAA: NEGATIVE
Neisseria Gonorrhoeae by PCR: NEGATIVE

## 2020-04-06 LAB — STREP GP B NAA: Strep Gp B NAA: NEGATIVE

## 2020-04-11 ENCOUNTER — Ambulatory Visit (INDEPENDENT_AMBULATORY_CARE_PROVIDER_SITE_OTHER): Payer: Medicaid Other | Admitting: Certified Nurse Midwife

## 2020-04-11 ENCOUNTER — Encounter: Payer: Self-pay | Admitting: Certified Nurse Midwife

## 2020-04-11 ENCOUNTER — Other Ambulatory Visit: Payer: Self-pay

## 2020-04-11 VITALS — BP 106/70 | HR 96 | Wt 151.1 lb

## 2020-04-11 DIAGNOSIS — Z3483 Encounter for supervision of other normal pregnancy, third trimester: Secondary | ICD-10-CM

## 2020-04-11 DIAGNOSIS — Z3A38 38 weeks gestation of pregnancy: Secondary | ICD-10-CM

## 2020-04-11 LAB — POCT URINALYSIS DIPSTICK OB
Bilirubin, UA: NEGATIVE
Blood, UA: NEGATIVE
Glucose, UA: NEGATIVE
Ketones, UA: NEGATIVE
Leukocytes, UA: NEGATIVE
Nitrite, UA: NEGATIVE
POC,PROTEIN,UA: NEGATIVE
Spec Grav, UA: 1.01 (ref 1.010–1.025)
Urobilinogen, UA: 0.2 E.U./dL
pH, UA: 6.5 (ref 5.0–8.0)

## 2020-04-11 NOTE — Progress Notes (Signed)
ROB-Doing well, reports irregular contractions. Started herbal prep. Discussed home treatment measures. Reviewed red flag symptoms and when to call. RTC x 1 week for ROB or sooner if needed.

## 2020-04-11 NOTE — Patient Instructions (Signed)
Vaginal Delivery  Vaginal delivery means that you give birth by pushing your baby out of your birth canal (vagina). A team of health care providers will help you before, during, and after vaginal delivery. Birth experiences are unique for every woman and every pregnancy, and birth experiences vary depending on where you choose to give birth. What happens when I arrive at the birth center or hospital? Once you are in labor and have been admitted into the hospital or birth center, your health care provider may:  Review your pregnancy history and any concerns that you have.  Insert an IV into one of your veins. This may be used to give you fluids and medicines.  Check your blood pressure, pulse, temperature, and heart rate (vital signs).  Check whether your bag of water (amniotic sac) has broken (ruptured).  Talk with you about your birth plan and discuss pain control options. Monitoring Your health care provider may monitor your contractions (uterine monitoring) and your baby's heart rate (fetal monitoring). You may need to be monitored:  Often, but not continuously (intermittently).  All the time or for long periods at a time (continuously). Continuous monitoring may be needed if: ? You are taking certain medicines, such as medicine to relieve pain or make your contractions stronger. ? You have pregnancy or labor complications. Monitoring may be done by:  Placing a special stethoscope or a handheld monitoring device on your abdomen to check your baby's heartbeat and to check for contractions.  Placing monitors on your abdomen (external monitors) to record your baby's heartbeat and the frequency and length of contractions.  Placing monitors inside your uterus through your vagina (internal monitors) to record your baby's heartbeat and the frequency, length, and strength of your contractions. Depending on the type of monitor, it may remain in your uterus or on your baby's head until  birth.  Telemetry. This is a type of continuous monitoring that can be done with external or internal monitors. Instead of having to stay in bed, you are able to move around during telemetry. Physical exam Your health care provider may perform frequent physical exams. This may include:  Checking how and where your baby is positioned in your uterus.  Checking your cervix to determine: ? Whether it is thinning out (effacing). ? Whether it is opening up (dilating). What happens during labor and delivery? Normal labor and delivery is divided into the following three stages: Stage 1  This is the longest stage of labor.  This stage can last for hours or days.  Throughout this stage, you will feel contractions. Contractions generally feel mild, infrequent, and irregular at first. They get stronger, more frequent (about every 2-3 minutes), and more regular as you move through this stage.  This stage ends when your cervix is completely dilated to 4 inches (10 cm) and completely effaced. Stage 2  This stage starts once your cervix is completely effaced and dilated and lasts until the delivery of your baby.  This stage may last from 20 minutes to 2 hours.  This is the stage where you will feel an urge to push your baby out of your vagina.  You may feel stretching and burning pain, especially when the widest part of your baby's head passes through the vaginal opening (crowning).  Once your baby is delivered, the umbilical cord will be clamped and cut. This usually occurs after waiting a period of 1-2 minutes after delivery.  Your baby will be placed on your bare chest (skin-to-skin   contact) in an upright position and covered with a warm blanket. Watch your baby for feeding cues, like rooting or sucking, and help the baby to your breast for his or her first feeding. Stage 3  This stage starts immediately after the birth of your baby and ends after you deliver the placenta.  This stage may  take anywhere from 5 to 30 minutes.  After your baby has been delivered, you will feel contractions as your body expels the placenta and your uterus contracts to control bleeding.   What can I expect after labor and delivery?  After labor is over, you and your baby will be monitored closely until you are ready to go home to ensure that you are both healthy. Your health care team will teach you how to care for yourself and your baby.  You and your baby will stay in the same room (rooming in) during your hospital stay. This will encourage early bonding and successful breastfeeding.  You may continue to receive fluids and medicines through an IV.  Your uterus will be checked and massaged regularly (fundal massage).  You will have some soreness and pain in your abdomen, vagina, and the area of skin between your vaginal opening and your anus (perineum).  If an incision was made near your vagina (episiotomy) or if you had some vaginal tearing during delivery, cold compresses may be placed on your episiotomy or your tear. This helps to reduce pain and swelling.  You may be given a squirt bottle to use instead of wiping when you go to the bathroom. To use the squirt bottle, follow these steps: ? Before you urinate, fill the squirt bottle with warm water. Do not use hot water. ? After you urinate, while you are sitting on the toilet, use the squirt bottle to rinse the area around your urethra and vaginal opening. This rinses away any urine and blood. ? Fill the squirt bottle with clean water every time you use the bathroom.  It is normal to have vaginal bleeding after delivery. Wear a sanitary pad for vaginal bleeding and discharge. Summary  Vaginal delivery means that you will give birth by pushing your baby out of your birth canal (vagina).  Your health care provider may monitor your contractions (uterine monitoring) and your baby's heart rate (fetal monitoring).  Your health care provider may  perform a physical exam.  Normal labor and delivery is divided into three stages.  After labor is over, you and your baby will be monitored closely until you are ready to go home. This information is not intended to replace advice given to you by your health care provider. Make sure you discuss any questions you have with your health care provider. Document Revised: 04/02/2017 Document Reviewed: 04/02/2017 Elsevier Patient Education  2021 Elsevier Inc.   Fetal Movement Counts Patient Name: ________________________________________________ Patient Due Date: ____________________  What is a fetal movement count? A fetal movement count is the number of times that you feel your baby move during a certain amount of time. This may also be called a fetal kick count. A fetal movement count is recommended for every pregnant woman. You may be asked to start counting fetal movements as early as week 28 of your pregnancy. Pay attention to when your baby is most active. You may notice your baby's sleep and wake cycles. You may also notice things that make your baby move more. You should do a fetal movement count:  When your baby is normally most  most active.  At the same time each day. A good time to count movements is while you are resting, after having something to eat and drink. How do I count fetal movements? 1. Find a quiet, comfortable area. Sit, or lie down on your side. 2. Write down the date, the start time and stop time, and the number of movements that you felt between those two times. Take this information with you to your health care visits. 3. Write down your start time when you feel the first movement. 4. Count kicks, flutters, swishes, rolls, and jabs. You should feel at least 10 movements. 5. You may stop counting after you have felt 10 movements, or if you have been counting for 2 hours. Write down the stop time. 6. If you do not feel 10 movements in 2 hours, contact your health care  provider for further instructions. Your health care provider may want to do additional tests to assess your baby's well-being. Contact a health care provider if:  You feel fewer than 10 movements in 2 hours.  Your baby is not moving like he or she usually does. Date: ____________ Start time: ____________ Stop time: ____________ Movements: ____________ Date: ____________ Start time: ____________ Stop time: ____________ Movements: ____________ Date: ____________ Start time: ____________ Stop time: ____________ Movements: ____________ Date: ____________ Start time: ____________ Stop time: ____________ Movements: ____________ Date: ____________ Start time: ____________ Stop time: ____________ Movements: ____________ Date: ____________ Start time: ____________ Stop time: ____________ Movements: ____________ Date: ____________ Start time: ____________ Stop time: ____________ Movements: ____________ Date: ____________ Start time: ____________ Stop time: ____________ Movements: ____________ Date: ____________ Start time: ____________ Stop time: ____________ Movements: ____________ This information is not intended to replace advice given to you by your health care provider. Make sure you discuss any questions you have with your health care provider. Document Revised: 10/16/2018 Document Reviewed: 10/16/2018 Elsevier Patient Education  2021 Elsevier Inc.  

## 2020-04-11 NOTE — Progress Notes (Signed)
ROB-Pt present for routine prenatal care. Pt c/o braxton hick contractions and lower abd pain.

## 2020-04-14 ENCOUNTER — Encounter: Payer: Medicaid Other | Admitting: Certified Nurse Midwife

## 2020-04-18 ENCOUNTER — Encounter: Payer: Medicaid Other | Admitting: Certified Nurse Midwife

## 2020-04-19 ENCOUNTER — Telehealth: Payer: Self-pay

## 2020-04-19 NOTE — Telephone Encounter (Signed)
mychart message sent to patient to reschedule missed appointment 04/18/20

## 2020-04-23 ENCOUNTER — Inpatient Hospital Stay: Admit: 2020-04-23 | Payer: Self-pay

## 2020-04-25 ENCOUNTER — Encounter: Payer: Self-pay | Admitting: Certified Nurse Midwife

## 2020-04-25 ENCOUNTER — Ambulatory Visit (INDEPENDENT_AMBULATORY_CARE_PROVIDER_SITE_OTHER): Payer: Medicaid Other | Admitting: Certified Nurse Midwife

## 2020-04-25 ENCOUNTER — Other Ambulatory Visit: Payer: Self-pay

## 2020-04-25 VITALS — BP 95/71 | HR 101 | Wt 152.9 lb

## 2020-04-25 DIAGNOSIS — Z3403 Encounter for supervision of normal first pregnancy, third trimester: Secondary | ICD-10-CM | POA: Diagnosis not present

## 2020-04-25 DIAGNOSIS — Z3A40 40 weeks gestation of pregnancy: Secondary | ICD-10-CM | POA: Diagnosis not present

## 2020-04-25 LAB — POCT URINALYSIS DIPSTICK OB
Bilirubin, UA: NEGATIVE
Blood, UA: NEGATIVE
Glucose, UA: NEGATIVE
Ketones, UA: NEGATIVE
Leukocytes, UA: NEGATIVE
Nitrite, UA: NEGATIVE
POC,PROTEIN,UA: NEGATIVE
Spec Grav, UA: 1.01 (ref 1.010–1.025)
Urobilinogen, UA: 0.2 E.U./dL
pH, UA: 7.5 (ref 5.0–8.0)

## 2020-04-25 NOTE — Progress Notes (Signed)
OB-Pt present for routine prenatal care. Pt c/o lower back/abd pain and contractions.

## 2020-04-25 NOTE — Progress Notes (Signed)
I have seen, interviewed, and examined the patient in conjunction with the [facility] [employer] and affirm the diagnosis and management plan.   Gunnar Bulla, CNM [facility], [facility] 04/25/20 3:14 PM

## 2020-04-25 NOTE — Patient Instructions (Addendum)
Fetal Movement Counts Patient Name: ________________________________________________ Patient Due Date: ____________________  What is a fetal movement count? A fetal movement count is the number of times that you feel your baby move during a certain amount of time. This may also be called a fetal kick count. A fetal movement count is recommended for every pregnant woman. You may be asked to start counting fetal movements as early as week 28 of your pregnancy. Pay attention to when your baby is most active. You may notice your baby's sleep and wake cycles. You may also notice things that make your baby move more. You should do a fetal movement count:  When your baby is normally most active.  At the same time each day. A good time to count movements is while you are resting, after having something to eat and drink. How do I count fetal movements? 1. Find a quiet, comfortable area. Sit, or lie down on your side. 2. Write down the date, the start time and stop time, and the number of movements that you felt between those two times. Take this information with you to your health care visits. 3. Write down your start time when you feel the first movement. 4. Count kicks, flutters, swishes, rolls, and jabs. You should feel at least 10 movements. 5. You may stop counting after you have felt 10 movements, or if you have been counting for 2 hours. Write down the stop time. 6. If you do not feel 10 movements in 2 hours, contact your health care provider for further instructions. Your health care provider may want to do additional tests to assess your baby's well-being. Contact a health care provider if:  You feel fewer than 10 movements in 2 hours.  Your baby is not moving like he or she usually does. Date: ____________ Start time: ____________ Stop time: ____________ Movements: ____________ Date: ____________ Start time: ____________ Stop time: ____________ Movements: ____________ Date: ____________  Start time: ____________ Stop time: ____________ Movements: ____________ Date: ____________ Start time: ____________ Stop time: ____________ Movements: ____________ Date: ____________ Start time: ____________ Stop time: ____________ Movements: ____________ Date: ____________ Start time: ____________ Stop time: ____________ Movements: ____________ Date: ____________ Start time: ____________ Stop time: ____________ Movements: ____________ Date: ____________ Start time: ____________ Stop time: ____________ Movements: ____________ Date: ____________ Start time: ____________ Stop time: ____________ Movements: ____________ This information is not intended to replace advice given to you by your health care provider. Make sure you discuss any questions you have with your health care provider. Document Revised: 10/16/2018 Document Reviewed: 10/16/2018 Elsevier Patient Education  2021 [employer].   Nonstress Test A nonstress test is a procedure that is done during pregnancy in order to check the baby's heartbeat. The procedure can help to show if the baby (fetus) is healthy. It is commonly done when:  The baby is past his or her due date.  The pregnancy is high risk.  The baby is moving less than normal.  The mother has lost a pregnancy in the past.  The health care provider suspects a problem with the baby's growth.  There is too much or too little amniotic fluid. The procedure is often done in the third trimester of pregnancy to find out if an early delivery is needed and whether such a delivery is safe. During a nonstress test, the baby's heartbeat is monitored when the baby is resting and when the baby is moving. If the baby is healthy, the heart rate will increase when he or she moves or  kicks and will return to normal when he or she rests. Tell a health care provider about:  Any allergies you have.  Any medical conditions you have.  All medicines you are taking, including vitamins,  herbs, eye drops, creams, and over-the-counter medicines.  Any surgeries you have had.  Any past pregnancies you have had. What are the risks? There are no risks to you or your baby from a nonstress test. This procedure should not be painful or uncomfortable. What happens before the procedure?  Eat a meal right before the test or as directed by your health care provider. Food may help encourage the baby to move.  Use the restroom right before the test. What happens during the procedure?  Two monitors will be placed on your abdomen. One will record the baby's heart rate and the other will record the contractions of your uterus.  You may be asked to lie down on your side or to sit upright.  You may be given a button to press when you feel your baby move.  Your health care provider will listen to your baby's heartbeat and record it. He or she may also watch your baby's heartbeat on a screen.  If the baby seems to be sleeping, you may be asked to drink some juice or soda, eat a snack, or change positions. The procedure may vary among health care providers and hospitals.   What can I expect after procedure?  Your health care provider will discuss the test results with you and make recommendations for the future. Depending on the results, your health care provider may order additional tests or another course of action.  If your health care provider gave you any diet or activity instructions, make sure to follow them.  Keep all follow-up visits. This is important. Summary  A nonstress test is a procedure that is done during pregnancy in order to check the baby's heartbeat. The procedure can help show if the baby is healthy.  The procedure is often done in the third trimester of pregnancy to find out if an early delivery is needed and whether such a delivery is safe.  During a nonstress test, the baby's heartbeat is monitored when the baby is resting and when the baby is moving. If the  baby is healthy, the heart rate will increase when he or she moves or kicks and will return to normal when he or she rests.  Your health care provider will discuss the test results with you and make recommendations for the future. This information is not intended to replace advice given to you by your health care provider. Make sure you discuss any questions you have with your health care provider. Document Revised: 12/07/2019 Document Reviewed: 12/07/2019 Elsevier Patient Education  2021 [employer] Inc.    Vaginal Delivery  Vaginal delivery means that you give birth by pushing your baby out of your birth canal (vagina). A team of health care providers will help you before, during, and after vaginal delivery. Birth experiences are unique for every woman and every pregnancy, and birth experiences vary depending on where you choose to give birth. What happens when I arrive at the birth center or hospital? Once you are in labor and have been admitted into the hospital or birth center, your health care provider may:  Review your pregnancy history and any concerns that you have.  Insert an IV into one of your veins. This may be used to give you fluids and medicines.  Check your  blood pressure, pulse, temperature, and heart rate (vital signs).  Check whether your bag of water (amniotic sac) has broken (ruptured).  Talk with you about your birth plan and discuss pain control options. Monitoring Your health care provider may monitor your contractions (uterine monitoring) and your baby's heart rate (fetal monitoring). You may need to be monitored:  Often, but not continuously (intermittently).  All the time or for long periods at a time (continuously). Continuous monitoring may be needed if: ? You are taking certain medicines, such as medicine to relieve pain or make your contractions stronger. ? You have pregnancy or labor complications. Monitoring may be done by:  Placing a special  stethoscope or a handheld monitoring device on your abdomen to check your baby's heartbeat and to check for contractions.  Placing monitors on your abdomen (external monitors) to record your baby's heartbeat and the frequency and length of contractions.  Placing monitors inside your uterus through your vagina (internal monitors) to record your baby's heartbeat and the frequency, length, and strength of your contractions. Depending on the type of monitor, it may remain in your uterus or on your baby's head until birth.  Telemetry. This is a type of continuous monitoring that can be done with external or internal monitors. Instead of having to stay in bed, you are able to move around during telemetry. Physical exam Your health care provider may perform frequent physical exams. This may include:  Checking how and where your baby is positioned in your uterus.  Checking your cervix to determine: ? Whether it is thinning out (effacing). ? Whether it is opening up (dilating). What happens during labor and delivery? Normal labor and delivery is divided into the following three stages: Stage 1  This is the longest stage of labor.  This stage can last for hours or days.  Throughout this stage, you will feel contractions. Contractions generally feel mild, infrequent, and irregular at first. They get stronger, more frequent (about every 2-3 minutes), and more regular as you move through this stage.  This stage ends when your cervix is completely dilated to 4 inches (10 cm) and completely effaced. Stage 2  This stage starts once your cervix is completely effaced and dilated and lasts until the delivery of your baby.  This stage may last from 20 minutes to 2 hours.  This is the stage where you will feel an urge to push your baby out of your vagina.  You may feel stretching and burning pain, especially when the widest part of your baby's head passes through the vaginal opening (crowning).  Once  your baby is delivered, the umbilical cord will be clamped and cut. This usually occurs after waiting a period of 1-2 minutes after delivery.  Your baby will be placed on your bare chest (skin-to-skin contact) in an upright position and covered with a warm blanket. Watch your baby for feeding cues, like rooting or sucking, and help the baby to your breast for his or her first feeding. Stage 3  This stage starts immediately after the birth of your baby and ends after you deliver the placenta.  This stage may take anywhere from 5 to 30 minutes.  After your baby has been delivered, you will feel contractions as your body expels the placenta and your uterus contracts to control bleeding.   What can I expect after labor and delivery?  After labor is over, you and your baby will be monitored closely until you are ready to go home to  ensure that you are both healthy. Your health care team will teach you how to care for yourself and your baby.  You and your baby will stay in the same room (rooming in) during your hospital stay. This will encourage early bonding and successful breastfeeding.  You may continue to receive fluids and medicines through an IV.  Your uterus will be checked and massaged regularly (fundal massage).  You will have some soreness and pain in your abdomen, vagina, and the area of skin between your vaginal opening and your anus (perineum).  If an incision was made near your vagina (episiotomy) or if you had some vaginal tearing during delivery, cold compresses may be placed on your episiotomy or your tear. This helps to reduce pain and swelling.  You may be given a squirt bottle to use instead of wiping when you go to the bathroom. To use the squirt bottle, follow these steps: ? Before you urinate, fill the squirt bottle with warm water. Do not use hot water. ? After you urinate, while you are sitting on the toilet, use the squirt bottle to rinse the area around your urethra and  vaginal opening. This rinses away any urine and blood. ? Fill the squirt bottle with clean water every time you use the bathroom.  It is normal to have vaginal bleeding after delivery. Wear a sanitary pad for vaginal bleeding and discharge. Summary  Vaginal delivery means that you will give birth by pushing your baby out of your birth canal (vagina).  Your health care provider may monitor your contractions (uterine monitoring) and your baby's heart rate (fetal monitoring).  Your health care provider may perform a physical exam.  Normal labor and delivery is divided into three stages.  After labor is over, you and your baby will be monitored closely until you are ready to go home. This information is not intended to replace advice given to you by your health care provider. Make sure you discuss any questions you have with your health care provider. Document Revised: 04/02/2017 Document Reviewed: 04/02/2017 Elsevier Patient Education  2021 [employer].   First Stage of Labor Labor is your body's natural process of moving your baby and other structures, including the placenta and umbilical cord, out of your uterus. There are three stages of labor. How long each stage lasts is different for every woman. But certain events happen during each stage that are the same for everyone.  The first stage starts when true labor begins. This stage ends when your cervix, which is the opening from your uterus into your vagina, is completely open (dilated).  The second stage begins when your cervix is fully dilated and you start pushing. This stage ends when your baby is born.  The third stage is the delivery of the organ that nourished your baby during pregnancy (placenta). First stage of labor As your due date gets closer, you may start to notice certain physical changes that mean labor is going to start soon. You may feel that your baby has dropped lower into your pelvis. You may experience irregular,  often painless, contractions that go away when you walk around or lie down ([employer] contractions). This is also called false labor. The first stage of labor begins when you start having contractions that come at regular (evenly spaced) intervals and your cervix starts to get thinner and wider in preparation for your baby to pass through. Birth care providers measure the dilation of your cervix in centimeters (cm). One  centimeter is a little less than one-half of an inch. The first stage ends when your cervix is dilated to 10 cm. The first stage of labor is divided into three phases:  Early phase.  Active phase.  Transitional phase. The length of the first stage of labor varies. It may be longer if this is your first pregnancy. You may spend most of this stage at home trying to relax and stay comfortable. How does this affect me? During the first stage of labor, you will move through three phases. What happens in the early phase?  You will start to have regular contractions that last 30-60 seconds. Contractions may come every 5-20 minutes. Keep track of your contractions and call your birth care provider.  Your water may break during this phase.  You may notice a clear or slightly bloody discharge of mucus (mucus plug) from your vagina.  Your cervix will dilate to 3-6 cm. What happens in the active phase? The active phase usually lasts 3-5 hours. You may go to the hospital or birth center around this time. During the active phase:  Your contractions will become stronger, longer, and more uncomfortable.  Your contractions may last 45-90 seconds and come every 3-5 minutes.  You may feel lower back pain.  Your birth care providers may examine your cervix and feel your belly to find the position of your baby.  You may have a monitor strapped to your belly to measure your contractions and your baby's heart rate.  You may start using your pain management options.  Your cervix may be  dilated to 6 cm and may start to dilate more quickly. What happens in the transitional phase? The transitional phase typically lasts from 30 minutes to 2 hours. At the end of this phase, your cervix will be fully dilated to 10 cm. During the transitional phase:  Contractions will get stronger and longer.  Contractions may last 60-90 seconds and come less than 2 minutes apart.  You may feel hot flashes, chills, or nausea. How does this affect my baby? During the first stage of labor, your baby will gradually move down into your birth canal. Follow these instructions at home and in the hospital or birth center:  When labor first begins, try to stay calm. You are still in the early phase. If it is night, try to get some sleep. If it is day, try to relax and save your energy. You may want to make some calls and get ready to go to the hospital or birth center.  When you are in the early phase, try these methods to help ease discomfort: ? Deep breathing and muscle relaxation. ? Taking a walk. ? Taking a warm bath or shower.  Drink some fluids and have a light snack if you feel like it.  Keep track of your contractions.  Based on the plan you created with your birth care provider, call when your contractions indicate it is time.  If your water breaks, note the time, color, and odor of the fluid.  When you are in the active phase, do your breathing exercises and rely on your support people and your team of birth care providers.   Contact a health care provider if:  Your contractions are strong and regular.  You have lower back pain or cramping.  Your water breaks.  You lose your mucus plug. Get help right away if you:  Have a severe headache that does not go away.  Have changes in  your vision.  Have severe pain in your upper belly.  Do not feel the baby move.  Have bright red bleeding. Summary  The first stage of labor starts when true labor begins, and it ends when your  cervix is dilated to 10 cm.  The first stage of labor has three phases: early, active, and transitional.  Your baby moves into the birth canal during the first stage of labor.  You may have contractions that become stronger and longer. You may also lose your mucus plug and have your water break.  Call your birth care provider when your contractions are frequent and strong enough to go to the hospital or birth center. This information is not intended to replace advice given to you by your health care provider. Make sure you discuss any questions you have with your health care provider. Document Revised: 06/19/2018 Document Reviewed: 05/12/2017 Elsevier Patient Education  2021 [employer].    Third Trimester of Pregnancy  The third trimester of pregnancy is from week 28 through week 40. This is also called months 7 through 9. This trimester is when your unborn baby (fetus) is growing very fast. At the end of the ninth month, the unborn baby is about 20 inches long. It weighs about 6-10 pounds. Body changes during your third trimester Your body continues to go through many changes during this time. The changes vary and generally return to normal after the baby is born. Physical changes  Your weight will continue to increase. You may gain 25-35 pounds (11-16 kg) by the end of the pregnancy. If you are underweight, you may gain 28-40 lb (about 13-18 kg). If you are overweight, you may gain 15-25 lb (about 7-11 kg).  You may start to get stretch marks on your hips, belly (abdomen), and breasts.  Your breasts will continue to grow and may hurt. A yellow fluid (colostrum) may leak from your breasts. This is the first milk you are making for your baby.  You may have changes in your hair.  Your belly button may stick out.  You may have more swelling in your hands, face, or ankles. Health changes  You may have heartburn.  You may have trouble pooping (constipation).  You may get  hemorrhoids. These are swollen veins in the butt that can itch or get painful.  You may have swollen veins (varicose veins) in your legs.  You may have more body aches in the pelvis, back, or thighs.  You may have more tingling or numbness in your hands, arms, and legs. The skin on your belly may also feel numb.  You may feel short of breath as your womb (uterus) gets bigger. Other changes  You may pee (urinate) more often.  You may have more problems sleeping.  You may notice the unborn baby "dropping," or moving lower in your belly.  You may have more discharge coming from your vagina.  Your joints may feel loose, and you may have pain around your pelvic bone. Follow these instructions at home: Medicines  Take over-the-counter and prescription medicines only as told by your doctor. Some medicines are not safe during pregnancy.  Take a prenatal vitamin that contains at least 600 micrograms (mcg) of folic acid. Eating and drinking  Eat healthy meals that include: ? Fresh fruits and vegetables. ? Whole grains. ? Good sources of protein, such as meat, eggs, or tofu. ? Low-fat dairy products.  Avoid raw meat and unpasteurized juice, milk, and cheese. These carry germs  that can harm you and your baby.  Eat 4 or 5 small meals rather than 3 large meals a day.  You may need to take these actions to prevent or treat trouble pooping: ? Drink enough fluids to keep your pee (urine) pale yellow. ? Eat foods that are high in fiber. These include beans, whole grains, and fresh fruits and vegetables. ? Limit foods that are high in fat and sugar. These include fried or sweet foods. Activity  Exercise only as told by your doctor. Stop exercising if you start to have cramps in your womb.  Avoid heavy lifting.  Do not exercise if it is too hot or too humid, or if you are in a place of great height (high altitude).  If you choose to, you may have sex unless your doctor tells you not  to. Relieving pain and discomfort  Take breaks often, and rest with your legs raised (elevated) if you have leg cramps or low back pain.  Take warm water baths (sitz baths) to soothe pain or discomfort caused by hemorrhoids. Use hemorrhoid cream if your doctor approves.  Wear a good support bra if your breasts are tender.  If you develop bulging, swollen veins in your legs: ? Wear support hose as told by your doctor. ? Raise your feet for 15 minutes, 3-4 times a day. ? Limit salt in your food. Safety  Talk to your doctor before traveling far distances.  Do not use hot tubs, steam rooms, or saunas.  Wear your seat belt at all times when you are in a car.  Talk with your doctor if someone is hurting you or yelling at you a lot. Preparing for your baby's arrival To prepare for the arrival of your baby:  Take prenatal classes.  Visit the hospital and tour the maternity area.  Buy a rear-facing car seat. Learn how to install it in your car.  Prepare the baby's room. Take out all pillows and stuffed animals from the baby's crib. General instructions  Avoid cat litter boxes and soil used by cats. These carry germs that can cause harm to the baby and can cause a loss of your baby by miscarriage or stillbirth.  Do not douche or use tampons. Do not use scented sanitary pads.  Do not smoke or use any products that contain nicotine or tobacco. If you need help quitting, ask your doctor.  Do not drink alcohol.  Do not use herbal medicines, illegal drugs, or medicines that were not approved by your doctor. Chemicals in these products can affect your baby.  Keep all follow-up visits. This is important. Where to find more information  American Pregnancy Association: americanpregnancy.org  [employer]: www.acog.org  Office on Women's Health: [employer]co.nz Contact a doctor if:  You have a fever.  You have mild cramps or  pressure in your lower belly.  You have a nagging pain in your belly area.  You vomit, or you have watery poop (diarrhea).  You have bad-smelling fluid coming from your vagina.  You have pain when you pee, or your pee smells bad.  You have a headache that does not go away when you take medicine.  You have changes in how you see, or you see spots in front of your eyes. Get help right away if:  Your water breaks.  You have regular contractions that are less than 5 minutes apart.  You are spotting or bleeding from your vagina.  You have very  bad belly cramps or pain.  You have trouble breathing.  You have chest pain.  You faint.  You have not felt the baby move for the amount of time told by your doctor.  You have new or increased pain, swelling, or redness in an arm or leg. Summary  The third trimester is from week 28 through week 40 (months 7 through 9). This is the time when your unborn baby is growing very fast.  During this time, your discomfort may increase as you gain weight and as your baby grows.  Get ready for your baby to arrive by taking prenatal classes, buying a rear-facing car seat, and preparing the baby's room.  Get help right away if you are bleeding from your vagina, you have chest pain and trouble breathing, or you have not felt the baby move for the amount of time told by your doctor. This information is not intended to replace advice given to you by your health care provider. Make sure you discuss any questions you have with your health care provider. Document Revised: 08/05/2019 Document Reviewed: 06/11/2019 Elsevier Patient Education  2021 [employer].

## 2020-04-25 NOTE — Progress Notes (Signed)
ROB- Doing well overall, reports consistent contractions last night but then stopped by morning. Declined SVE. Missed her last OB appointment due to oversleeping and forgot to reschedule.   NST today reactive, see chart.   Anticipatory guidance regarding course of prenatal care. Reviewed red flag symptoms and when to call.  RTC on Thursday for NST and ROB with JML or sooner if needed.   Juliann Pares, Student-Mid[employer]ife [employer] 04/25/20 1:30 PM

## 2020-04-28 ENCOUNTER — Ambulatory Visit (INDEPENDENT_AMBULATORY_CARE_PROVIDER_SITE_OTHER): Payer: Medicaid Other | Admitting: Certified Nurse Midwife

## 2020-04-28 ENCOUNTER — Other Ambulatory Visit: Payer: Medicaid Other

## 2020-04-28 ENCOUNTER — Other Ambulatory Visit: Payer: Self-pay

## 2020-04-28 VITALS — BP 107/76 | HR 118 | Wt 156.1 lb

## 2020-04-28 DIAGNOSIS — Z3A40 40 weeks gestation of pregnancy: Secondary | ICD-10-CM | POA: Diagnosis not present

## 2020-04-28 DIAGNOSIS — Z3483 Encounter for supervision of other normal pregnancy, third trimester: Secondary | ICD-10-CM

## 2020-04-28 LAB — POCT URINALYSIS DIPSTICK OB
Bilirubin, UA: NEGATIVE
Blood, UA: NEGATIVE
Glucose, UA: NEGATIVE
Ketones, UA: NEGATIVE
Leukocytes, UA: NEGATIVE
Nitrite, UA: NEGATIVE
POC,PROTEIN,UA: NEGATIVE
Spec Grav, UA: <=1.005 — AB (ref 1.010–1.025)
Urobilinogen, UA: 0.2 E.U./dL
pH, UA: 7.5 (ref 5.0–8.0)

## 2020-04-28 NOTE — Patient Instructions (Signed)
Fetal Movement Counts Patient Name: ________________________________________________ Patient Due Date: ____________________  What is a fetal movement count? A fetal movement count is the number of times that you feel your baby move during a certain amount of time. This may also be called a fetal kick count. A fetal movement count is recommended for every pregnant woman. You may be asked to start counting fetal movements as early as week 28 of your pregnancy. Pay attention to when your baby is most active. You may notice your baby's sleep and wake cycles. You may also notice things that make your baby move more. You should do a fetal movement count:  When your baby is normally most active.  At the same time each day. A good time to count movements is while you are resting, after having something to eat and drink. How do I count fetal movements? 1. Find a quiet, comfortable area. Sit, or lie down on your side. 2. Write down the date, the start time and stop time, and the number of movements that you felt between those two times. Take this information with you to your health care visits. 3. Write down your start time when you feel the first movement. 4. Count kicks, flutters, swishes, rolls, and jabs. You should feel at least 10 movements. 5. You may stop counting after you have felt 10 movements, or if you have been counting for 2 hours. Write down the stop time. 6. If you do not feel 10 movements in 2 hours, contact your health care provider for further instructions. Your health care provider may want to do additional tests to assess your baby's well-being. Contact a health care provider if:  You feel fewer than 10 movements in 2 hours.  Your baby is not moving like he or she usually does. Date: ____________ Start time: ____________ Stop time: ____________ Movements: ____________ Date: ____________ Start time: ____________ Stop time: ____________ Movements: ____________ Date: ____________  Start time: ____________ Stop time: ____________ Movements: ____________ Date: ____________ Start time: ____________ Stop time: ____________ Movements: ____________ Date: ____________ Start time: ____________ Stop time: ____________ Movements: ____________ Date: ____________ Start time: ____________ Stop time: ____________ Movements: ____________ Date: ____________ Start time: ____________ Stop time: ____________ Movements: ____________ Date: ____________ Start time: ____________ Stop time: ____________ Movements: ____________ Date: ____________ Start time: ____________ Stop time: ____________ Movements: ____________ This information is not intended to replace advice given to you by your health care provider. Make sure you discuss any questions you have with your health care provider. Document Revised: 10/16/2018 Document Reviewed: 10/16/2018 Elsevier Patient Education  2021 Elsevier Inc.   Augmentation of Labor Augmentation of labor is when steps are taken to stimulate and strengthen contractions of the uterus during labor. This may be done when contractions have slowed or stopped, and the progress of labor and delivery of the baby is delayed. Before augmentation of labor begins, these factors will be considered:  The mother's medical condition and the baby's condition.  The size and position of the baby.  The size of the birth canal. Tell a health care provider about:  Any allergies you have.  All medicines you are taking, including vitamins, herbs, eye drops, creams, and over-the-counter medicines.  Any problems you or your family members have had with anesthetic medicines.  Any surgeries you have had.  Any blood disorders you have.  Any medical conditions you have. What are the risks? Generally, this is a safe procedure. However, problems may occur, including:  Tearing (rupture) of the uterus.  Breaking off (abruption) of the placenta.  Increased risk of infection for  you and your baby.  Increased risk of cesarean, forceps, or vacuum delivery.  Excessive bleeding after delivery (postpartum hemorrhage).  Umbilical cord prolapse. This can cause the umbilical cord to get squeezed during birth.  Too much stimulation of the contractions. This can result in continuous, prolonged, or very strong contractions.  Your baby could fail to get enough blood flow or oxygen. This can be life-threatening (fetal death). What happens during the procedure? Augmentation of labor may include:  Giving you medicine that stimulates contractions (oxytocin). This is given through an IV that is inserted into a vein in your arm.  Breaking the fluid-filled sac that surrounds the fetus (amniotic sac). This procedure is called artificial rupture of membranes. This procedure may vary among health care providers and hospitals.   Summary  Augmentation of labor is when steps are taken to stimulate and strengthen contractions of the uterus during labor. This may be done when contractions have slowed down or stopped, and the progress of labor and delivery of the baby is delayed.  Labor augmentation may be done using medicine to stimulate contractions (oxytocin). Or it can be done by breaking the fluid-filled sac that surrounds the fetus (amniotic sac).  Generally, this is a safe procedure. However, problems can occur. Talk with your health care provider about the potential risks and benefits of labor augmentation if this is offered to you. This information is not intended to replace advice given to you by your health care provider. Make sure you discuss any questions you have with your health care provider. Document Revised: 12/10/2019 Document Reviewed: 12/10/2019 Elsevier Patient Education  2021 [employer].   Labor Induction Labor induction is when steps are taken to cause a pregnant woman to begin the labor process. Most women go into labor on their own between 37 weeks and 42  weeks of pregnancy. When this does not happen, or when there is a medical need for labor to begin, steps may be taken to induce, or bring on, labor. Labor induction causes a pregnant woman's uterus to contract. It also causes the cervix to soften (ripen), open (dilate), and thin out. Usually, labor is not induced before 39 weeks of pregnancy unless there is a medical reason to do so. When is labor induction considered? Labor induction may be right for you if:  Your pregnancy lasts longer than 41 to 42 weeks.  Your placenta is separating from your uterus (placental abruption).  You have a rupture of membranes and your labor does not begin.  You have health problems, like diabetes or high blood pressure (preeclampsia) during your pregnancy.  Your baby has stopped growing or does not have enough amniotic fluid. Before labor induction begins, your health care provider will consider the following factors:  Your medical condition and the baby's condition.  How many weeks you have been pregnant.  How mature the baby's lungs are.  The condition of your cervix.  The position of the baby.  The size of your birth canal. Tell a health care provider about:  Any allergies you have.  All medicines you are taking, including vitamins, herbs, eye drops, creams, and over-the-counter medicines.  Any problems you or your family members have had with anesthetic medicines.  Any surgeries you have had.  Any blood disorders you have.  Any medical conditions you have. What are the risks? Generally, this is a safe procedure. However, problems may occur, including:  Failed  induction.  Changes in fetal heart rate, such as being too high, too low, or irregular (erratic).  Infection in the mother or the baby.  Increased risk of having a cesarean delivery.  Breaking off (abruption) of the placenta from the uterus. This is rare.  Rupture of the uterus. This is very rare.  Your baby could fail  to get enough blood flow or oxygen. This can be life-threatening. When induction is needed for medical reasons, the benefits generally outweigh the risks. What happens during the procedure? During the procedure, your health care provider will use one of these methods to induce labor:  Stripping the membranes. In this method, the amniotic sac tissue is gently separated from the cervix. This causes the following to happen: ? Your cervix stretches, which in turn causes the release of prostaglandins. ? Prostaglandins induce labor and cause the uterus to contract. ? This procedure is often done in an office visit. You will be sent home to wait for contractions to begin.  Prostaglandin medicine. This medicine starts contractions and causes the cervix to dilate and ripen. This can be taken by mouth (orally) or by being inserted into the vagina (suppository).  Inserting a small, thin tube (catheter) with a balloon into the vagina and then expanding the balloon with water to dilate the cervix.  Breaking the water. In this method, a small instrument is used to make a small hole in the amniotic sac. This eventually causes the amniotic sac to break. Contractions should begin within a few hours.  Medicine to trigger or strengthen contractions. This medicine is given through an IV that is inserted into a vein in your arm. This procedure may vary among health care providers and hospitals.   Where to find more information  [employer] of [employer]: www.marchofdimes.org  [employer]: www.acog.org Summary  Labor induction causes a pregnant woman's uterus to contract. It also causes the cervix to soften (ripen), open (dilate), and thin out.  Labor is usually not induced before 39 weeks of pregnancy unless there is a medical reason to do so.  When induction is needed for medical reasons, the benefits generally outweigh the risks.  Talk with your health care provider about  which methods of labor induction are right for you. This information is not intended to replace advice given to you by your health care provider. Make sure you discuss any questions you have with your health care provider. Document Revised: 12/10/2019 Document Reviewed: 12/10/2019 Elsevier Patient Education  2021 [employer].

## 2020-04-28 NOTE — Progress Notes (Signed)
Patient come in today for ROB and NST. Patient has no concerns today.

## 2020-04-29 ENCOUNTER — Other Ambulatory Visit
Admission: RE | Admit: 2020-04-29 | Discharge: 2020-04-29 | Disposition: A | Discharge status: Home / Self Care | Payer: Medicaid Other | Source: Ambulatory Visit | Attending: Certified Nurse Midwife | Admitting: Certified Nurse Midwife

## 2020-04-29 DIAGNOSIS — Z01812 Encounter for preprocedural laboratory examination: Secondary | ICD-10-CM | POA: Insufficient documentation

## 2020-04-29 DIAGNOSIS — Z20822 Contact with and (suspected) exposure to covid-19: Secondary | ICD-10-CM | POA: Insufficient documentation

## 2020-04-30 ENCOUNTER — Inpatient Hospital Stay
Admission: EM | Admit: 2020-04-30 | Discharge: 2020-05-02 | DRG: 807 | Disposition: A | Discharge status: Home / Self Care | Payer: Medicaid Other | Attending: Certified Nurse Midwife | Admitting: Certified Nurse Midwife

## 2020-04-30 ENCOUNTER — Other Ambulatory Visit: Payer: Self-pay

## 2020-04-30 ENCOUNTER — Inpatient Hospital Stay: Payer: Medicaid Other | Admitting: Anesthesiology

## 2020-04-30 ENCOUNTER — Encounter: Payer: Self-pay | Admitting: Certified Nurse Midwife

## 2020-04-30 DIAGNOSIS — Z3A41 41 weeks gestation of pregnancy: Secondary | ICD-10-CM | POA: Diagnosis not present

## 2020-04-30 DIAGNOSIS — Z3490 Encounter for supervision of normal pregnancy, unspecified, unspecified trimester: Secondary | ICD-10-CM | POA: Diagnosis present

## 2020-04-30 DIAGNOSIS — O480 Post-term pregnancy: Secondary | ICD-10-CM | POA: Diagnosis not present

## 2020-04-30 DIAGNOSIS — Z20822 Contact with and (suspected) exposure to covid-19: Secondary | ICD-10-CM | POA: Diagnosis present

## 2020-04-30 HISTORY — DX: Anxiety disorder, unspecified: F41.9

## 2020-04-30 LAB — SARS CORONAVIRUS 2 (TAT 6-24 HRS): SARS Coronavirus 2: NEGATIVE

## 2020-04-30 LAB — CBC
HCT: 34.9 % — ABNORMAL LOW (ref 36.0–46.0)
Hemoglobin: 12 g/dL (ref 12.0–15.0)
MCH: 31.7 pg (ref 26.0–34.0)
MCHC: 34.4 g/dL (ref 30.0–36.0)
MCV: 92.1 fL (ref 80.0–100.0)
Platelets: 276 10*3/uL (ref 150–400)
RBC: 3.79 MIL/uL — ABNORMAL LOW (ref 3.87–5.11)
RDW: 13.5 % (ref 11.5–15.5)
WBC: 12.3 10*3/uL — ABNORMAL HIGH (ref 4.0–10.5)
nRBC: 0 % (ref 0.0–0.2)

## 2020-04-30 LAB — RAPID HIV SCREEN (HIV 1/2 AB+AG)
HIV 1/2 Antibodies: NONREACTIVE
HIV-1 P24 Antigen - HIV24: NONREACTIVE

## 2020-04-30 LAB — OB RESULTS CONSOLE HIV ANTIBODY (ROUTINE TESTING): HIV: NONREACTIVE

## 2020-04-30 LAB — TYPE AND SCREEN
ABO/RH(D): A POS
Antibody Screen: NEGATIVE

## 2020-04-30 MED ORDER — DIPHENHYDRAMINE HCL 25 MG PO CAPS: 25.0000 mg | ORAL_CAPSULE | ORAL | Status: DC | PRN

## 2020-04-30 MED ORDER — ONDANSETRON HCL 4 MG/2ML IJ SOLN
4.0000 mg | Freq: Four times a day (QID) | INTRAMUSCULAR | Status: DC | PRN
  Administered 2020-04-30: 4 mg via INTRAVENOUS
  Filled 2020-04-30: qty 2

## 2020-04-30 MED ORDER — AMMONIA AROMATIC IN INHA
RESPIRATORY_TRACT | Status: AC
  Filled 2020-04-30: qty 10

## 2020-04-30 MED ORDER — FENTANYL 2.5 MCG/ML W/ROPIVACAINE 0.15% IN NS 100 ML EPIDURAL (ARMC): 12.0000 mL/h | EPIDURAL | Status: DC

## 2020-04-30 MED ORDER — WITCH HAZEL-GLYCERIN EX PADS: 1.0000 "application " | MEDICATED_PAD | CUTANEOUS | Status: DC | PRN

## 2020-04-30 MED ORDER — ONDANSETRON HCL 4 MG PO TABS: 4.0000 mg | ORAL_TABLET | ORAL | Status: DC | PRN

## 2020-04-30 MED ORDER — PRENATAL MULTIVITAMIN CH
1.0000 | ORAL_TABLET | Freq: Every day | ORAL | Status: DC
  Administered 2020-05-01: 1 via ORAL
  Filled 2020-04-30: qty 1

## 2020-04-30 MED ORDER — OXYTOCIN BOLUS FROM INFUSION
333.0000 mL | Freq: Once | INTRAVENOUS | Status: AC
  Administered 2020-04-30: 333 mL via INTRAVENOUS

## 2020-04-30 MED ORDER — SODIUM CHLORIDE 0.9% FLUSH: 3.0000 mL | INTRAVENOUS | Status: DC | PRN

## 2020-04-30 MED ORDER — DIPHENHYDRAMINE HCL 25 MG PO CAPS: 25.0000 mg | ORAL_CAPSULE | Freq: Four times a day (QID) | ORAL | Status: DC | PRN

## 2020-04-30 MED ORDER — BUTORPHANOL TARTRATE 1 MG/ML IJ SOLN: 1.0000 mg | INTRAMUSCULAR | Status: DC | PRN

## 2020-04-30 MED ORDER — ONDANSETRON HCL 4 MG/2ML IJ SOLN: 4.0000 mg | INTRAMUSCULAR | Status: DC | PRN

## 2020-04-30 MED ORDER — DIBUCAINE (PERIANAL) 1 % EX OINT: 1.0000 "application " | TOPICAL_OINTMENT | CUTANEOUS | Status: DC | PRN

## 2020-04-30 MED ORDER — SOD CITRATE-CITRIC ACID 500-334 MG/5ML PO SOLN
30.0000 mL | ORAL | Status: DC | PRN
Start: 2020-04-30 — End: 2020-04-30

## 2020-04-30 MED ORDER — FENTANYL 2.5 MCG/ML W/ROPIVACAINE 0.15% IN NS 100 ML EPIDURAL (ARMC)
EPIDURAL | Status: DC | PRN
  Administered 2020-04-30: 12 mL/h via EPIDURAL

## 2020-04-30 MED ORDER — CALCIUM CARBONATE ANTACID 500 MG PO CHEW
800.0000 mg | CHEWABLE_TABLET | Freq: Two times a day (BID) | ORAL | Status: DC
  Filled 2020-04-30: qty 4

## 2020-04-30 MED ORDER — BENZOCAINE-MENTHOL 20-0.5 % EX AERO: 1.0000 "application " | INHALATION_SPRAY | CUTANEOUS | Status: DC | PRN

## 2020-04-30 MED ORDER — NALOXONE HCL 0.4 MG/ML IJ SOLN: 0.4000 mg | INTRAMUSCULAR | Status: DC | PRN

## 2020-04-30 MED ORDER — METHYLERGONOVINE MALEATE 0.2 MG/ML IJ SOLN: 0.2000 mg | INTRAMUSCULAR | Status: DC | PRN

## 2020-04-30 MED ORDER — NALBUPHINE HCL 10 MG/ML IJ SOLN: 5.0000 mg | Freq: Once | INTRAMUSCULAR | Status: DC | PRN

## 2020-04-30 MED ORDER — TERBUTALINE SULFATE 1 MG/ML IJ SOLN: 0.2500 mg | Freq: Once | INTRAMUSCULAR | Status: DC | PRN

## 2020-04-30 MED ORDER — SENNOSIDES-DOCUSATE SODIUM 8.6-50 MG PO TABS
2.0000 | ORAL_TABLET | ORAL | Status: DC
  Administered 2020-05-02: 2 via ORAL
  Filled 2020-04-30: qty 2

## 2020-04-30 MED ORDER — LIDOCAINE HCL (PF) 1 % IJ SOLN: 30.0000 mL | INTRAMUSCULAR | Status: DC | PRN

## 2020-04-30 MED ORDER — OXYTOCIN-SODIUM CHLORIDE 30-0.9 UT/500ML-% IV SOLN
1.0000 m[IU]/min | INTRAVENOUS | Status: DC
  Administered 2020-04-30: 2 m[IU]/min via INTRAVENOUS
  Filled 2020-04-30: qty 500

## 2020-04-30 MED ORDER — ACETAMINOPHEN 325 MG PO TABS
650.0000 mg | ORAL_TABLET | ORAL | Status: DC | PRN
  Administered 2020-05-01: 650 mg via ORAL
  Filled 2020-04-30: qty 2

## 2020-04-30 MED ORDER — ONDANSETRON HCL 4 MG/2ML IJ SOLN: 4.0000 mg | Freq: Three times a day (TID) | INTRAMUSCULAR | Status: DC | PRN

## 2020-04-30 MED ORDER — METHYLERGONOVINE MALEATE 0.2 MG PO TABS: 0.2000 mg | ORAL_TABLET | ORAL | Status: DC | PRN

## 2020-04-30 MED ORDER — NALBUPHINE HCL 10 MG/ML IJ SOLN: 5.0000 mg | INTRAMUSCULAR | Status: DC | PRN

## 2020-04-30 MED ORDER — LACTATED RINGERS IV SOLN: INTRAVENOUS | Status: DC

## 2020-04-30 MED ORDER — SCOPOLAMINE 1 MG/3DAYS TD PT72: 1.0000 | MEDICATED_PATCH | Freq: Once | TRANSDERMAL | Status: DC

## 2020-04-30 MED ORDER — LIDOCAINE HCL (PF) 1 % IJ SOLN
INTRAMUSCULAR | Status: AC
  Filled 2020-04-30: qty 30

## 2020-04-30 MED ORDER — MISOPROSTOL 200 MCG PO TABS
ORAL_TABLET | ORAL | Status: AC
  Filled 2020-04-30: qty 4

## 2020-04-30 MED ORDER — OXYTOCIN 10 UNIT/ML IJ SOLN
INTRAMUSCULAR | Status: AC
  Filled 2020-04-30: qty 2

## 2020-04-30 MED ORDER — LIDOCAINE-EPINEPHRINE (PF) 1.5 %-1:200000 IJ SOLN
INTRAMUSCULAR | Status: DC | PRN
  Administered 2020-04-30: 3 mL via EPIDURAL

## 2020-04-30 MED ORDER — SIMETHICONE 80 MG PO CHEW: 80.0000 mg | CHEWABLE_TABLET | ORAL | Status: DC | PRN

## 2020-04-30 MED ORDER — DIPHENHYDRAMINE HCL 50 MG/ML IJ SOLN: 12.5000 mg | INTRAMUSCULAR | Status: DC | PRN

## 2020-04-30 MED ORDER — SODIUM CHLORIDE 0.9 % IV SOLN: 250.0000 mL | INTRAVENOUS | Status: DC | PRN

## 2020-04-30 MED ORDER — FENTANYL 2.5 MCG/ML W/ROPIVACAINE 0.15% IN NS 100 ML EPIDURAL (ARMC)
EPIDURAL | Status: AC
  Filled 2020-04-30: qty 100

## 2020-04-30 MED ORDER — CALCIUM CARBONATE ANTACID 500 MG PO CHEW
CHEWABLE_TABLET | ORAL | Status: AC
  Administered 2020-04-30: 200 mg
  Filled 2020-04-30: qty 1

## 2020-04-30 MED ORDER — OXYCODONE-ACETAMINOPHEN 5-325 MG PO TABS: 2.0000 | ORAL_TABLET | ORAL | Status: DC | PRN

## 2020-04-30 MED ORDER — ALBUTEROL SULFATE (2.5 MG/3ML) 0.083% IN NEBU: 3.0000 mL | INHALATION_SOLUTION | Freq: Four times a day (QID) | RESPIRATORY_TRACT | Status: DC | PRN

## 2020-04-30 MED ORDER — ACETAMINOPHEN 325 MG PO TABS: 650.0000 mg | ORAL_TABLET | ORAL | Status: DC | PRN

## 2020-04-30 MED ORDER — SODIUM CHLORIDE 0.9% FLUSH: 3.0000 mL | Freq: Two times a day (BID) | INTRAVENOUS | Status: DC

## 2020-04-30 MED ORDER — ZOLPIDEM TARTRATE 5 MG PO TABS: 5.0000 mg | ORAL_TABLET | Freq: Every evening | ORAL | Status: DC | PRN

## 2020-04-30 MED ORDER — IBUPROFEN 600 MG PO TABS
600.0000 mg | ORAL_TABLET | Freq: Four times a day (QID) | ORAL | Status: DC
  Administered 2020-05-01 – 2020-05-02 (×5): 600 mg via ORAL
  Filled 2020-04-30 (×5): qty 1

## 2020-04-30 MED ORDER — LACTATED RINGERS IV SOLN
500.0000 mL | INTRAVENOUS | Status: DC | PRN
  Administered 2020-04-30 (×2): 500 mL via INTRAVENOUS

## 2020-04-30 MED ORDER — MISOPROSTOL 50MCG HALF TABLET
50.0000 ug | ORAL_TABLET | ORAL | Status: DC | PRN
  Administered 2020-04-30: 50 ug via ORAL
  Filled 2020-04-30: qty 1

## 2020-04-30 MED ORDER — OXYTOCIN-SODIUM CHLORIDE 30-0.9 UT/500ML-% IV SOLN: 2.5000 [IU]/h | INTRAVENOUS | Status: DC

## 2020-04-30 MED ORDER — NALOXONE HCL 4 MG/10ML IJ SOLN
1.0000 ug/kg/h | INTRAVENOUS | Status: DC | PRN
  Filled 2020-04-30: qty 5

## 2020-04-30 MED ORDER — COCONUT OIL OIL: 1.0000 "application " | TOPICAL_OIL | Status: DC | PRN

## 2020-04-30 MED ORDER — OXYCODONE-ACETAMINOPHEN 5-325 MG PO TABS: 1.0000 | ORAL_TABLET | ORAL | Status: DC | PRN

## 2020-04-30 MED ORDER — MEPERIDINE HCL 50 MG/ML IJ SOLN
6.2500 mg | INTRAMUSCULAR | Status: DC | PRN
Start: 2020-04-30 — End: 2020-04-30

## 2020-04-30 NOTE — Progress Notes (Signed)
ROB-Ready to have baby, notes irregular contractions. SVE unchanged since previous exam. NST reactive, see chart. IOL scheduled for Saturday, 04/30/2020; see orders. Anticipatory guidance regarding course of prenatal care. Reviewed red flag symptoms and when to call. Advised COVID testing tomorrow. Report to [facility] at [facility] for IOL as scheduled or sooner if needed.

## 2020-04-30 NOTE — Progress Notes (Signed)
Patient ID: Mikayla Morales, female   DOB: March 18, 1990, 29 y.o.   MRN: [id number]  Mikayla Morales is a 30 y.o. G3P1011 at [redacted]w[redacted]d by LMP admitted for induction of labor due to Post dates. Due date 04/23/2020.  Subjective:  Patient sitting in bed, reports mild contractions similar to period cramps.   Denies difficulty breathing or respiratory distress, chest pain, dysuria, and leg pain.   FOB at bedside for continuous labor support.   Objective:  Temp:  [98 F (36.7 C)] 98 F (36.7 C) (02/19 0829) Pulse Rate:  [90] 90 (02/19 0829) Resp:  [18] 18 (02/19 0829) BP: (122)/(73) 122/73 (02/19 0829)  Fetal Wellbeing:  Category I  UC:   regular, every two (2) to four (4) minutes; soft resting tone  SVE:   Dilation: 4 Effacement (%): 50 Station: -2 Exam by:: Willodean Rosenthal CNM  AROM-moderate amount, light meconium  Labs: Lab Results  Component Value Date   WBC 12.3 (H) 04/30/2020   HGB 12.0 04/30/2020   HCT 34.9 (L) 04/30/2020   MCV 92.1 04/30/2020   PLT 276 04/30/2020    Assessment:  Mikayla Morales a 30 y.o.G3P1011 at [redacted]w[redacted]d admitted for induction of labor due to postdates pregnancy, Rh positive, GBS negative, AROM-light meconium  FHR Category I   Plan:  Advised patient unable to administer additional doses of cytotec at this time due to frequency of contractions.   Offered expectant management, AROM or pitocin augmentation; associated risks and benefits of each reviewed.   Patient wishes to proceed with AROM, performed by CNM without difficulties.   Encouraged position change and use of peanut ball.   Reviewed red flag symptoms and when to call.   Continue orders as written. Reassess as needed.    Serafina Royals, CNM [facility], [facility] 04/30/2020, 2:39 PM

## 2020-04-30 NOTE — Anesthesia Preprocedure Evaluation (Signed)
Anesthesia Evaluation  Patient identified by MRN, date of birth, ID band Patient awake    Reviewed: Allergy & Precautions, H&P , NPO status , Patient's Chart, lab work & pertinent test results, reviewed documented beta blocker date and time   Airway Mallampati: II  TM Distance: >3 FB Neck ROM: full    Dental  (+) Teeth Intact   Pulmonary neg pulmonary ROS,    Pulmonary exam normal        Cardiovascular negative cardio ROS Normal cardiovascular exam     Neuro/Psych negative neurological ROS     GI/Hepatic negative GI ROS, Neg liver ROS,   Endo/Other  negative endocrine ROS  Renal/GU negative Renal ROS     Musculoskeletal negative musculoskeletal ROS (+)   Abdominal   Peds  Hematology negative hematology ROS (+)   Anesthesia Other Findings Past Medical History: No date: Anxiety No date: Nausea & vomiting  Past Surgical History: No date: broke nose  BMI    Body Mass Index: 28.53 kg/m      Reproductive/Obstetrics (+) Pregnancy                             Anesthesia Physical Anesthesia Plan  ASA: II  Anesthesia Plan: Epidural   Post-op Pain Management:    Induction:   PONV Risk Score and Plan: Treatment may vary due to age or medical condition  Airway Management Planned: Natural Airway  Additional Equipment:   Intra-op Plan:   Post-operative Plan:   Informed Consent: I have reviewed the patients History and Physical, chart, labs and discussed the procedure including the risks, benefits and alternatives for the proposed anesthesia with the patient or authorized representative who has indicated his/her understanding and acceptance.     Dental Advisory Given  Plan Discussed with: CRNA  Anesthesia Plan Comments:         Anesthesia Quick Evaluation

## 2020-04-30 NOTE — Anesthesia Procedure Notes (Signed)
Epidural Patient location during procedure: OB Start time: 04/30/2020 5:59 PM End time: 04/30/2020 6:04 PM  Staffing Anesthesiologist: Christia Reading, MD Performed: anesthesiologist   Preanesthetic Checklist Completed: patient identified, IV checked, site marked, risks and benefits discussed, surgical consent, monitors and equipment checked, pre-op evaluation and timeout performed  Epidural Patient position: sitting Prep: ChloraPrep Patient monitoring: heart rate, continuous pulse ox and blood pressure Approach: right paramedian Location: L3-L4 Injection technique: LOR saline  Needle:  Needle type: Tuohy  Needle gauge: 18 G Needle length: 9 cm and 9 Needle insertion depth: 5 cm Catheter type: closed end flexible Catheter size: 20 Guage Catheter at skin depth: 9 cm Test dose: negative and 1.5% lidocaine with Epi 1:200 K  Assessment Sensory level: T11 Events: blood not aspirated, injection not painful, no injection resistance, no paresthesia and negative IV test  Additional Notes R Paramedian + LOR NS, 1st pass, Cath easily passed. No difficulties whatsoever. Patient tolerated the insertion well without complications.Reason for block:procedure for pain

## 2020-04-30 NOTE — Progress Notes (Signed)
[place], CNM gave verbal order that patient can be saline locked prior to transfer to postpartum/mother baby.

## 2020-04-30 NOTE — Progress Notes (Addendum)
Patient ID: Mikayla Morales, female   DOB: 1990/04/08, 30 y.o.   MRN: [id number]  Mikayla Morales is a 30 y.o. G3P1011 at [redacted]w[redacted]d by LMP admitted for induction of labor due to Post dates. Due date 04/23/2020.  Subjective:  Patient standing in room, reports occasional contractions less than 10 minutes apart.   Denies difficulty breathing or respiratory distress, chest pain, vaginal bleeding, leakage of fluid and leg pain.   FOB at bedside for continuous labor support.   Objective:  Temp:  [98 F (36.7 C)] 98 F (36.7 C) (02/19 0829) Pulse Rate:  [90] 90 (02/19 0829) Resp:  [18] 18 (02/19 0829) BP: (122)/(73) 122/73 (02/19 0829)  Fetal Wellbeing:  Category I  UC:   irregular, every two (2) to four (4) minutes; soft resting tone  SVE:   Dilation: 4 Effacement (%): 50 Station: -2 Exam by:: Mikayla Morales, CNM  Labs: Lab Results  Component Value Date   WBC 12.3 (H) 04/30/2020   HGB 12.0 04/30/2020   HCT 34.9 (L) 04/30/2020   MCV 92.1 04/30/2020   PLT 276 04/30/2020    Assessment:  Mikayla Morales is a 30 y.o. G3P1011 at [redacted]w[redacted]d admitted for induction of labor due to postdates pregnancy, Rh positive, GBS negative  FHR Category I  Plan:  Encouraged position change and use of peanut ball.   Reviewed red flag symptoms and when to call.   Continue orders as written. Reassess as needed.    Mikayla Morales, CNM [facility], [facility] 04/30/2020, 12:39 PM

## 2020-04-30 NOTE — H&P (Signed)
Obstetric History and Physical  Mikayla Morales is a 30 y.o. G3P1011 with IUP at [redacted]w[redacted]d presenting for induction of labor due to postdates pregnancy.   Patient states she has been having  irregula contractions, none vaginal bleeding, intact membranes, with active fetal movement.    Denies difficulty breathing or respiratory distress, chest pain, abdominal pain, dysuria, and leg pain or swelling.   Prenatal Course  Source of Care: [facility]-initial visit at  13 weeks, total visits: 13  Pregnancy complications or risks: None  Prenatal labs and studies:  ABO, Rh: A/Positive/-- 11-04-2022 0925)  Antibody: Negative 11-04-2022 0925)  Rubella: 1.21 11/04/22 6387)  Varicella: 307 Nov 04, 2022 0925)  RPR: Non Reactive (11/22 1024)   HBsAg: Negative November 04, 2022 0925)   HIV: pending  FIE:PPIRJJOA/-- (01/24 1159)  1 hr Glucola: 83 (11/22 1024)  Genetic screening: Declined  Anatomy US: Complete, normal (09/30 1438)  Past Medical History:  Diagnosis Date  . Anxiety   . Nausea & vomiting     Past Surgical History:  Procedure Laterality Date  . broke nose      OB History  Gravida Para Term Preterm AB Living  3 1 1   1 1   SAB IAB Ectopic Multiple Live Births    1   0 1    # Outcome Date GA Lbr Len/2nd Weight Sex Delivery Anes PTL Lv  3 Current           2 Term 07/09/16 [redacted]w[redacted]d 13:15 / 02:08 3220 g F Vag-Spont EPI  LIV  1 IAB 06/11/15 [redacted]w[redacted]d           Social History   Socioeconomic History  . Marital status: Significant Other    Spouse name: Dusty  . Number of children: Not on file  . Years of education: Not on file  . Highest education level: Not on file  Occupational History  . Occupation: serves food  Tobacco Use  . Smoking status: Never Smoker  . Smokeless tobacco: Never Used  Vaping Use  . Vaping Use: Never used  Substance and Sexual Activity  . Alcohol use: Not Currently  . Drug use: Not Currently  . Sexual activity: Yes    Birth control/protection: None    Comment: undecided   Other Topics Concern  . Not on file  Social History Narrative  . Not on file   Social Determinants of Health   Financial Resource Strain: Not on file  Food Insecurity: Not on file  Transportation Needs: Not on file  Physical Activity: Not on file  Stress: Not on file  Social Connections: Not on file    Family History  Problem Relation Age of Onset  . Heart disease Father   . Breast cancer Maternal Grandmother   . Ovarian cancer Neg Hx   . Colon cancer Neg Hx   . Diabetes Neg Hx     Medications Prior to Admission  Medication Sig Dispense Refill Last Dose  . Prenatal Vit-Fe Fumarate-FA (MULTIVITAMIN-PRENATAL) 27-0.8 MG TABS tablet Take 1 tablet by mouth daily at 12 noon.   04/29/2020 at Unknown time  . albuterol (VENTOLIN HFA) 108 (90 Base) MCG/ACT inhaler Inhale 2 puffs into the lungs every 6 (six) hours as needed for wheezing or shortness of breath. (Patient not taking: Reported on 04/30/2020) 8 g 0 Not Taking at Unknown time    No Known Allergies  Review of Systems: Negative except for what is mentioned in HPI.  Physical Exam:  Temp:  [98 F (36.7 C)] 98 F (  36.7 C) (02/19 0829) Pulse Rate:  [90] 90 (02/19 0829) Resp:  [18] 18 (02/19 0829) BP: (122)/(73) 122/73 (02/19 0829)  GENERAL: Well-developed, well-nourished female in no acute distress.   LUNGS: Clear to auscultation bilaterally.   HEART: Regular rate and rhythm.  ABDOMEN: Soft, nontender, nondistended, gravid.  EXTREMITIES: Nontender, no edema, 2+ distal pulses.  Cervical Exam: Dilation: 3 Effacement (%): Thick Cervical Position: Posterior Station: -3 Presentation: Vertex Exam by:: Christean Leaf RN  FHR Category I  Contractions: Irregular, soft resting tone   Pertinent Labs/Studies:   Results for orders placed or performed during the hospital encounter of 04/30/20 (from the past 24 hour(s))  CBC     Status: Abnormal   Collection Time: 04/30/20  8:42 AM  Result Value Ref Range   WBC 12.3 (H) 4.0  - 10.5 K/uL   RBC 3.79 (L) 3.87 - 5.11 MIL/uL   Hemoglobin 12.0 12.0 - 15.0 g/dL   HCT 00.7 (L) 62.2 - 63.3 %   MCV 92.1 80.0 - 100.0 fL   MCH 31.7 26.0 - 34.0 pg   MCHC 34.4 30.0 - 36.0 g/dL   RDW 35.4 56.2 - 56.3 %   Platelets 276 150 - 400 K/uL   nRBC 0.0 0.0 - 0.2 %    Assessment :  Mikayla Morales is a 30 y.o. G3P1011 at [redacted]w[redacted]d being admitted for induction of labor due to postdates pregnancy, Rh positive, GBS negative  FHR Category I  Plan:  Admit to birthing suites, see orders.   Induction/Augmentation as needed, per protocol.  Plan: Hopeful for vaginal birth.   Encouraged position change and use of peanut ball.   Dr. Logan Bores notified of admission and plan of care.    Gunnar Bulla, CNM [facility], [facility] 04/30/20 10:10 AM

## 2020-05-01 ENCOUNTER — Encounter: Payer: Self-pay | Admitting: Certified Nurse Midwife

## 2020-05-01 LAB — CBC
HCT: 33.1 % — ABNORMAL LOW (ref 36.0–46.0)
Hemoglobin: 11.2 g/dL — ABNORMAL LOW (ref 12.0–15.0)
MCH: 31.1 pg (ref 26.0–34.0)
MCHC: 33.8 g/dL (ref 30.0–36.0)
MCV: 91.9 fL (ref 80.0–100.0)
Platelets: 253 10*3/uL (ref 150–400)
RBC: 3.6 MIL/uL — ABNORMAL LOW (ref 3.87–5.11)
RDW: 13.6 % (ref 11.5–15.5)
WBC: 19.8 10*3/uL — ABNORMAL HIGH (ref 4.0–10.5)
nRBC: 0 % (ref 0.0–0.2)

## 2020-05-01 LAB — RPR: RPR Ser Ql: NONREACTIVE

## 2020-05-01 NOTE — Lactation Note (Signed)
This note was copied from a baby's chart. Lactation Consultation Note  Patient Name: Mikayla Morales QPRFF'M Date: 05/01/2020 Reason for consult: Initial assessment;Term Age:28 hours   P2 mother seen for initial lactation visit. Mother informs that things are going well. She mentioned that infant is sleepy. LC reassured her by providing education on normal infant behavior in first 24 hours of life. Mother felt better about infant behavior. She is an experienced BF parent with 18 months with previous child. She expressed that she does feel like she can get a better latch as infant is latching shallow this morning, but had 2 great feeds prior. Mother denies any current nipple or breast discomfort/pain.   Reviewed education on normal infant behavior, input/output, infant feeding cues, benefits of STS, and prevention/treatment of sore nipples. Parents are aware of lactation services. All questions and concerns answered at this time.   Plan: - Continue to feed infant on demand, following feeding cues, at least 8-12x or more in 24 hours period.  - Encouraged to use personal nipple balm for sore nipple prevention - Continue STS  Maternal Data Does the patient have breastfeeding experience prior to this delivery?: Yes How long did the patient breastfeed?: 18 months  Feeding Mother's Current Feeding Choice: Breast Milk Interventions Interventions: Breast feeding basics reviewed;Education   Consult Status Consult Status: Follow-up Date: 05/02/20 Follow-up type: In-patient    Mikayla Morales 05/01/2020, 11:21 AM

## 2020-05-01 NOTE — Progress Notes (Signed)
Patient ID: Mikayla Morales, female   DOB: [birth year], 30 y.o.   MRN: [id number]  Post Partum Day # 1, s/p spontaneous vaginal birth, Rh positive, GBS negative, Breastfeeding  Subjective:  Patient standing in room holding infant.   Notes infant has shallow latch, but "a few good feedings". Reports intermittent abdominal cramping, resolves with oral medication.   Denies difficulty breathing or respiratory distress, chest pain, dysuria, and leg pain.   Objective:  Temp:  [97.8 F (36.6 C)-98.6 F (37 C)] 98.3 F (36.8 C) (02/20 0812) Pulse Rate:  [66-105] 90 (02/20 0812) Resp:  [16-18] 18 (02/20 0812) BP: (107-119)/(57-84) 115/67 (02/20 0337) SpO2:  [98 %-100 %] 100 % (02/20 0812) Weight:  [70.8 kg-71 kg] 70.8 kg (02/19 1516)  Physical Exam:   General: alert and cooperative   Lungs: clear to auscultation bilaterally  Breasts: deferred, no complaints  Heart: normal apical impulse  Abdomen: soft, non-tender; bowel sounds normal; no masses,  no organomegaly  Pelvis: Lochia: appropriate, Uterine Fundus: firm  Extremities: DVT Evaluation: no evidence of DVT seen on physical exam.  Edinburgh Postnatal Depression Scale Screening Tool 05/01/2020 08/29/2016  I have been able to laugh and see the funny side of things. 0 0  I have looked forward with enjoyment to things. 0 0  I have blamed myself unnecessarily when things went wrong. 2 2  I have been anxious or worried for no good reason. 2 0  I have felt scared or panicky for no good reason. 2 1  Things have been getting on top of me. 1 1  I have been so unhappy that I have had difficulty sleeping. 0 0  I have felt sad or miserable. 0 0  I have been so unhappy that I have been crying. 0 0  The thought of harming myself has occurred to me. 0 0  Edinburgh Postnatal Depression Scale Total 7 4      Recent Labs    04/30/20 0842 05/01/20 0548  HGB 12.0 11.2*  HCT 34.9* 33.1*    Assessment:  30 year old G3P2, Post Partum  Day # 1, s/p spontaneous vaginal birth, Rh positive, GBS negative  Breastfeeding  Plan:  Routine postpartum care and orders.   Reviewed red flag symptoms and when to call.   Continue orders as written. Reassess as needed.   Discharge tomorrow.    LOS: 1 day   Gunnar Bulla, CNM [facility] 05/01/2020 11:07 AM

## 2020-05-01 NOTE — Plan of Care (Signed)
  Problem: Education: Goal: Knowledge of General Education information will improve Description: Including pain rating scale, medication(s)/side effects and non-pharmacologic comfort measures Outcome: Completed/Met   Problem: Health Behavior/Discharge Planning: Goal: Ability to manage health-related needs will improve Outcome: Completed/Met   Problem: Clinical Measurements: Goal: Ability to maintain clinical measurements within normal limits will improve Outcome: Completed/Met Goal: Will remain free from infection Outcome: Completed/Met Goal: Diagnostic test results will improve Outcome: Completed/Met Goal: Respiratory complications will improve Outcome: Completed/Met Goal: Cardiovascular complication will be avoided Outcome: Completed/Met   Problem: Activity: Goal: Risk for activity intolerance will decrease Outcome: Completed/Met   Problem: Nutrition: Goal: Adequate nutrition will be maintained Outcome: Completed/Met   Problem: Coping: Goal: Level of anxiety will decrease Outcome: Completed/Met   Problem: Elimination: Goal: Will not experience complications related to bowel motility Outcome: Completed/Met Goal: Will not experience complications related to urinary retention Outcome: Completed/Met   Problem: Pain Managment: Goal: General experience of comfort will improve Outcome: Completed/Met   Problem: Safety: Goal: Ability to remain free from injury will improve Outcome: Completed/Met   Problem: Skin Integrity: Goal: Risk for impaired skin integrity will decrease Outcome: Completed/Met   Problem: Education: Goal: Knowledge of condition will improve Outcome: Completed/Met Goal: Individualized Educational Video(s) Outcome: Completed/Met Goal: Individualized Newborn Educational Video(s) Outcome: Completed/Met   Problem: Activity: Goal: Will verbalize the importance of balancing activity with adequate rest periods Outcome: Completed/Met Goal: Ability to  tolerate increased activity will improve Outcome: Completed/Met   Problem: Coping: Goal: Ability to identify and utilize available resources and services will improve Outcome: Completed/Met   Problem: Life Cycle: Goal: Chance of risk for complications during the postpartum period will decrease Outcome: Completed/Met   Problem: Role Relationship: Goal: Ability to demonstrate positive interaction with newborn will improve Outcome: Completed/Met   Problem: Skin Integrity: Goal: Demonstration of wound healing without infection will improve Outcome: Completed/Met

## 2020-05-02 ENCOUNTER — Ambulatory Visit: Payer: Self-pay

## 2020-05-02 ENCOUNTER — Telehealth: Payer: Self-pay

## 2020-05-02 MED ORDER — IBUPROFEN 600 MG PO TABS: 600.0000 mg | ORAL_TABLET | Freq: Four times a day (QID) | ORAL | 0 refills | Status: DC

## 2020-05-02 MED ORDER — ACETAMINOPHEN 325 MG PO TABS: 650.0000 mg | ORAL_TABLET | ORAL | 0 refills | Status: DC | PRN

## 2020-05-02 NOTE — Lactation Note (Signed)
This note was copied from a baby's chart. Lactation Consultation Note  Patient Name: Mikayla Morales TIRWE'R Date: 05/02/2020 Reason for consult: Follow-up assessment Age:28 hours  Lactation Rounds: LC to the room for a visit. Mother and baby are in the recliner, and latching in football hole. Mother stated her left nipple is sore and is wanting assistance with the latch. LC encouraged turning baby more toward Mother with Baby's nose and toes going the same direction and baby's belly facing Mother. Also encouraged sandwiching the breast the same direction as the baby's smile. Mother stated the latch feels better.  LC reviewed and encouraged feeding on demand and with cues. If baby is not cueing we encourage hand expression and spoon feed to wake baby. Reviewed diaper counts for days of life and when to call Peds with questions. Reviewed "understanding Postpartum and Newborn care " booklet at bedside. Reviewed outpatient Lactation number and resources.  Mother stated understanding with all teaching.   Maternal Data Has patient been taught Hand Expression?: Yes Does the patient have breastfeeding experience prior to this delivery?: Yes How long did the patient breastfeed?: 18 months  Feeding Mother's Current Feeding Choice: Breast Milk  LATCH Score Latch: Grasps breast easily, tongue down, lips flanged, rhythmical sucking.  Audible Swallowing: Spontaneous and intermittent  Type of Nipple: Everted at rest and after stimulation  Comfort (Breast/Nipple): Soft / non-tender  Hold (Positioning): Assistance needed to correctly position infant at breast and maintain latch.  LATCH Score: 9  Interventions Interventions: Breast feeding basics reviewed;Assisted with latch;Hand express;Breast compression;Adjust position;Support pillows;Position options;Education  Discharge Discharge Education: Engorgement and breast care;Warning signs for feeding baby Pump: Personal  Consult  Status Consult Status: Complete Follow-up type: Call as needed    Mikayla Morales Mikayla Morales 05/02/2020, 2:52 PM

## 2020-05-02 NOTE — Discharge Summary (Signed)
Physician Obstetric Discharge Summary  Patient ID: Mikayla Morales MRN: [id number] DOB/AGE: 28/09/1990 28 y.o.   Date of Admission: 04/30/2020  Date of Discharge:   Admitting Diagnosis: Induction of labor at [redacted]w[redacted]d  Secondary Diagnosis:   Patient Active Problem List   Diagnosis Date Noted  . Encounter for induction of labor 04/30/2020  . Pregnancy 07/09/2016  . Vaginal delivery 07/09/2016  . H/O miscarriage, currently pregnant 12/29/2015  . Positive urine drug screen 12/20/2015  . Amenorrhea 11/29/2015  . Positive urine pregnancy test 11/29/2015  . Nausea/vomiting in pregnancy 11/29/2015   Mode of Delivery: normal spontaneous vaginal delivery     Discharge Diagnosis: No other diagnosis   Intrapartum Procedures: Atificial rupture of membranes, epidural, pitocin augmentation and cytotec induction   Post partum procedures: None  Complications: none   Brief Hospital Course   Mikayla Morales is a [id number] who had a SVD on 04/30/2020;  for further details of this birth, please refer to the delivery summary.  Patient had an uncomplicated postpartum course.  By time of discharge on PPD#2, her pain was controlled on oral pain medications; she had appropriate lochia and was ambulating, voiding without difficulty and tolerating regular diet.  She was deemed stable for discharge to home.    Labs: CBC Latest Ref Rng & Units 05/01/2020 04/30/2020 02/01/2020  WBC 4.0 - 10.5 K/uL 19.8(H) 12.3(H) 14.2(H)  Hemoglobin 12.0 - 15.0 g/dL 11.2(L) 12.0 12.0  Hematocrit 36.0 - 46.0 % 33.1(L) 34.9(L) 35.5  Platelets 150 - 400 K/uL 253 276 251   A POS  Physical exam:   Temp:  [98 F (36.7 C)-98.7 F (37.1 C)] 98 F (36.7 C) (02/20 2315) Pulse Rate:  [77-90] 83 (02/20 2315) Resp:  [18-20] 18 (02/20 2315) BP: (98-110)/(63-68) 106/63 (02/20 2315) SpO2:  [99 %-100 %] 100 % (02/20 2315)  General: alert and no distress  Lochia: appropriate  Abdomen: soft, NT  Uterine Fundus:  firm  Perineum: no significant drainage, no dehiscence, no significant erythema  Extremities: no evidence of DVT seen on physical exam  Edinburgh Postnatal Depression Scale Screening Tool 05/01/2020 08/29/2016  I have been able to laugh and see the funny side of things. 0 0  I have looked forward with enjoyment to things. 0 0  I have blamed myself unnecessarily when things went wrong. 2 2  I have been anxious or worried for no good reason. 2 0  I have felt scared or panicky for no good reason. 2 1  Things have been getting on top of me. 1 1  I have been so unhappy that I have had difficulty sleeping. 0 0  I have felt sad or miserable. 0 0  I have been so unhappy that I have been crying. 0 0  The thought of harming myself has occurred to me. 0 0  Edinburgh Postnatal Depression Scale Total 7 4     Discharge Instructions: Per After Visit Summary.  Activity: Advance as tolerated. Pelvic rest for 6 weeks.  Also refer to After Visit Summary  Diet: Regular  Medications: Allergies as of 05/02/2020   No Known Allergies     Medication List    TAKE these medications   acetaminophen 325 MG tablet Commonly known as: Tylenol Take 2 tablets (650 mg total) by mouth every 4 (four) hours as needed (for pain scale < 4).   albuterol 108 (90 Base) MCG/ACT inhaler Commonly known as: VENTOLIN HFA Inhale 2 puffs into the lungs every 6 (six) hours as needed for  wheezing or shortness of breath.   ibuprofen 600 MG tablet Commonly known as: ADVIL Take 1 tablet (600 mg total) by mouth every 6 (six) hours.   multivitamin-prenatal 27-0.8 MG Tabs tablet Take 1 tablet by mouth daily at 12 noon.      Outpatient follow up:   Follow-up Information    Gunnar Bulla, CNM Follow up.   Specialties: Certified Nurse Midwife, Obstetrics and Gynecology, Radiology Why: Someone from the office will call you to schedule:  Two (2) week TELEVISIT Six (6) week POSTPARTUM VISIT Contact  information: [address] [place] [address] [phone]              Postpartum contraception: natural family planning (NFP)  Discharged Condition: stable  Discharged to: home   Newborn Data:  Disposition:home with mother  Apgars: APGAR (1 MIN): 8   APGAR (5 MINS): 9    Baby Feeding: Breast  Serafina Royals, CNM  [facility], [facility] 05/02/20 5:20 AM

## 2020-05-02 NOTE — Progress Notes (Signed)
Pt discharged with infant. Discharge instructions, prescriptions, and follow up appointments given to and reviewed with patient. Pt verbalized understanding. To be escorted out by auxillary.  °

## 2020-05-02 NOTE — Discharge Instructions (Signed)
Postpartum Care After Vaginal Delivery The following information offers guidance about how to care for yourself from the time you deliver your baby to 6-12 weeks after delivery (postpartum period). If you have problems or questions, contact your health care provider for more specific instructions. Follow these instructions at home: Vaginal bleeding  It is normal to have vaginal bleeding (lochia) after delivery. Wear a sanitary pad for bleeding and discharge. ? During the first week after delivery, the amount and appearance of lochia is often similar to a menstrual period. ? Over the next few weeks, it will gradually decrease to a dry, yellow-brown discharge. ? For most women, lochia stops completely by 4-6 weeks after delivery, but can vary.  Change your sanitary pads frequently. Watch for any changes in your flow, such as: ? A sudden increase in volume. ? A change in color. ? Large blood clots.  If you pass a blood clot from your vagina, save it and call your health care provider. Do not flush blood clots down the toilet before talking with your health care provider.  Do not use tampons or douches until your health care provider approves.  If you are not breastfeeding, your period should return 6-8 weeks after delivery. If you are feeding your baby breast milk only, your period may not return until you stop breastfeeding. Perineal care  Keep the area between the vagina and the anus (perineum) clean and dry. Use medicated pads and pain-relieving sprays and creams as directed.  If you had a surgical cut in the perineum (episiotomy) or a tear, check the area for signs of infection until you are healed. Check for: ? More redness, swelling, or pain. ? Fluid or blood coming from the cut or tear. ? Warmth. ? Pus or a bad smell.  You may be given a squirt bottle to use instead of wiping to clean the perineum area after you use the bathroom. Pat the area gently to dry it.  To relieve pain  caused by an episiotomy, a tear, or swollen veins in the anus (hemorrhoids), take a warm sitz bath 2-3 times a day. In a sitz bath, the warm water should only come up to your hips and cover your buttocks.   Breast care  In the first few days after delivery, your breasts may feel heavy, full, and uncomfortable (breast engorgement). Milk may also leak from your breasts. Ask your health care provider about ways to help relieve the discomfort.  If you are breastfeeding: ? Wear a bra that supports your breasts and fits well. Use breast pads to absorb milk that leaks. ? Keep your nipples clean and dry. Apply creams and ointments as told. ? You may have uterine contractions every time you breastfeed for up to several weeks after delivery. This helps your uterus return to its normal size. ? If you have any problems with breastfeeding, notify your health care provider or lactation consultant.  If you are not breastfeeding: ? Avoid touching your breasts. Do not squeeze out (express) milk. Doing this can make your breasts produce more milk. ? Wear a good-fitting bra and use cold packs to help with swelling. Intimacy and sexuality  Ask your health care provider when you can engage in sexual activity. This may depend upon: ? Your risk of infection. ? How fast you are healing. ? Your comfort and desire to engage in sexual activity.  You are able to get pregnant after delivery, even if you have not had your period. Talk with   your health care provider about methods of birth control (contraception) or family planning if you desire future pregnancies. Medicines  Take over-the-counter and prescription medicines only as told by your health care provider.  Take an over-the-counter stool softener to help ease bowel movements as told by your health care provider.  If you were prescribed an antibiotic medicine, take it as told by your health care provider. Do not stop taking the antibiotic even if you start to  feel better.  Review all previous and current prescriptions to check for possible transfer into breast milk. Activity  Gradually return to your normal activities as told by your health care provider.  Rest as much as possible. Nap while your baby is sleeping. Eating and drinking  Drink enough fluid to keep your urine pale yellow.  To help prevent or relieve constipation, eat high-fiber foods every day.  Choose healthy eating to support breastfeeding or weight loss goals.  Take your prenatal vitamins until your health care provider tells you to stop.   General tips/recommendations  Do not use any products that contain nicotine or tobacco. These products include cigarettes, chewing tobacco, and vaping devices, such as e-cigarettes. If you need help quitting, ask your health care provider.  Do not drink alcohol, especially if you are breastfeeding.  Do not take medications or drugs that are not prescribed to you, especially if you are breastfeeding.  Visit your health care provider for a postpartum checkup within the first 3-6 weeks after delivery.  Complete a comprehensive postpartum visit no later than 12 weeks after delivery.  Keep all follow-up visits for you and your baby. Contact a health care provider if:  You feel unusually sad or worried.  Your breasts become red, painful, or hard.  You have a fever or other signs of an infection.  You have bleeding that is soaking through one pad an hour or you have blood clots.  You have a severe headache that doesn't go away or you have vision changes.  You have nausea and vomiting and are unable to eat or drink anything for 24 hours. Get help right away if:  You have chest pain or difficulty breathing.  You have sudden, severe leg pain.  You faint or have a seizure.  You have thoughts about hurting yourself or your baby. If you ever feel like you may hurt yourself or others, or have thoughts about taking your own life,  get help right away. Go to your nearest emergency department or:  Call your local emergency services (911 in the [place]  The National Suicide Prevention Lifeline at 1-800-273-8255. This suicide crisis helpline is open 24 hours a day.  Text the Crisis Text Line at 741741 (in the [place] Summary  The period of time after you deliver your newborn up to 6-12 weeks after delivery is called the postpartum period.  Keep all follow-up visits for you and your baby.  Review all previous and current prescriptions to check for possible transfer into breast milk.  Contact a health care provider if you feel unusually sad or worried during the postpartum period. This information is not intended to replace advice given to you by your health care provider. Make sure you discuss any questions you have with your health care provider. Document Revised: 11/12/2019 Document Reviewed: 11/12/2019 Elsevier Patient Education  2021 [employer] Postpartum Baby Blues The postpartum period begins right after the birth of a baby. During this time, there is often joy and excitement. It is also a   time of many changes in the life of the parents. A mother may feel happy one minute and sad or stressed the next. These feelings of sadness, called the baby blues, usually happen in the period right after the baby is born and go away within a week or two. What are the causes? The exact cause of this condition is not known. Changes in hormone levels after childbirth are believed to trigger some of the symptoms. Other factors that can play a role in these mood changes include:  Lack of sleep.  Stressful life events, such as financial problems, caring for a loved one, or death of a loved one.  Genetics. What are the signs or symptoms? Symptoms of this condition include:  Changes in mood, such as going from extreme happiness to sadness.  A decrease in concentration.  Difficulty sleeping.  Crying spells and  tearfulness.  Loss of appetite.  Irritability.  Anxiety. If these symptoms last for more than 2 weeks or become more severe, you may have postpartum depression. How is this diagnosed? This condition is diagnosed based on an evaluation of your symptoms. Your health care provider may use a screening tool that includes a list of questions to help identify a person with the baby blues or postpartum depression. How is this treated? The baby blues usually go away on their own in 1-2 weeks. Social support is often what is needed. You will be encouraged to get adequate sleep and rest. Follow these instructions at home: Lifestyle  Get as much rest as you can. Take a nap when the baby sleeps.  Exercise regularly as told by your health care provider. Some women find yoga and walking to be helpful.  Eat a balanced and nourishing diet. This includes plenty of fruits and vegetables, whole grains, and lean proteins.  Do little things that you enjoy. Take a bubble bath, read your favorite magazine, or listen to your favorite music.  Avoid alcohol.  Ask for help with household chores, cooking, grocery shopping, or running errands. Do not try to do everything yourself. Consider hiring a postpartum doula to help. This is a professional who specializes in providing support to new mothers.  Try not to make any major life changes during pregnancy or right after giving birth. This can add stress.      General instructions  Talk to people close to you about how you are feeling. Get support from your partner, family members, friends, or other new moms. You may want to join a support group.  Find ways to manage stress. This may include: ? Writing your thoughts and feelings in a journal. ? Spending time outside. ? Spending time with people who make you laugh.  Try to stay positive in how you think. Think about the things you are grateful for.  Take over-the-counter and prescription medicines only as  told by your health care provider.  Let your health care provider know if you have any concerns.  Keep all postpartum visits. This is important. Contact a health care provider if:  Your baby blues do not go away after 2 weeks. Get help right away if:  You have thoughts of taking your own life (suicidal thoughts), or of harming your baby or someone else.  You see or hear things that are not there (hallucinations). If you ever feel like you may hurt yourself or others, or have thoughts about taking your own life, get help right away. Go to your nearest emergency department or:  Call   your local emergency services (911 in the [place]  Call a suicide crisis helpline, such as the National Suicide Prevention Lifeline, at 1-800-273-8255. This is open 24 hours a day in the [place]  Text the Crisis Text Line at 741741 (in the [place] Summary  After giving birth, you may feel happy one minute and sad or stressed the next. Feelings of sadness that happen right after the baby is born and go away after a week or two are called the baby blues.  You can manage the baby blues by getting enough rest, eating a healthy diet, exercising, spending time with supportive people, and finding ways to manage stress.  If feelings of sadness and stress last longer than 2 weeks or get in the way of caring for your baby, talk with your health care provider. This may mean you have postpartum depression. This information is not intended to replace advice given to you by your health care provider. Make sure you discuss any questions you have with your health care provider. Document Revised: 08/21/2019 Document Reviewed: 08/21/2019 Elsevier Patient Education  2021 [employer] Breastfeeding Tips for a Good Latch Latching is how your baby's mouth attaches to your nipple to breastfeed. It is an important part of breastfeeding. Your baby may have trouble latching for a number of reasons, such as:  Not being in the right  position.  Using a bottle or pacifier too early.  Problems within your baby's mouth, tongue, or lips.  The shape of your nipples.  Your baby being born early (prematurely). Small babies often have a weak suck.  Breasts becoming overfilled with milk (engorged breasts).  Express a little milk to help soften the breast. Work with a breastfeeding specialist (lactation consultant) to help your baby have a good latch. How does this affect me? A poor latch may cause you to have problems such as:  Cracked nipples.  Sore nipples.  Breasts becoming overfilled with milk  Plugged milk ducts.  Low milk supply.  Breast inflammation.  Breast infection. How does this affect my baby? A poor latch may cause your baby to not be able to feed well. As a result, he or she may have trouble gaining weight. Follow these instructions at home: How to position your baby  Find a comfortable place to sit or lie down. Your neck and back should be well supported.  If you are seated, place a pillow or rolled-up blanket under your baby. This will bring him or her to the level of your breast.  Make sure that your baby's belly is facing your belly.  Try different positions to find one that works best for you and your baby. How to help your baby latch  To start, you might find it helpful to gently rub your breast. Move your fingertips in a circle as you massage from your chest wall toward your nipple. This helps milk flow. Keep doing this during feeding if needed.  Position your breast. Hold your breast with four fingers underneath and your thumb above your nipple. Keep your fingers away from your nipple and your baby's mouth. Follow these steps to help your baby latch: 1. Rub your baby's lips gently with your finger or nipple. 2. When your baby's mouth is open wide enough, quickly bring your baby to your breast and place your whole nipple into your baby's mouth. Place as much of the colored area around  your nipple (areola)as possible into your baby's mouth. 3. Your baby's tongue should be between   his or her lower gum and your breast. 4. You should be able to see more areola above your baby's upper lip than below the lower lip. 5. When your baby starts sucking, you will feel a gentle pull on your nipple. You should not feel any pain. Be patient. It is common for a baby to suck for about 2-3 minutes to start the flow of breast milk. 6. Make sure that your baby's mouth is in the right position around your nipple. Your baby's lips should make a seal on your breast and be turned outward.   General instructions  Look for these signs that your baby has latched on to your nipple: ? The baby is quietly tugging or sucking without causing you pain. ? You hear the baby swallow after every 3 or 4 sucks. ? You see movement above and in front of the baby's ears while he or she is sucking.  Be aware of these signs that your baby has not latched on to your nipple: ? The baby makes sucking sounds or smacking sounds while feeding. ? You have nipple pain.  If your baby is not latched well, put your little finger between your baby's gums and your nipple. This will break the seal. Then try to help your baby latch again.  If you need help, get help from a breastfeeding specialist. Contact a doctor if:  You have cracking or soreness in your nipples that lasts longer than 1 week.  You have nipple pain.  Your breasts are filled with too much milk (engorgement), and this does not improve after 48-72 hours.  You have a plugged milk duct and a fever.  You follow the tips for a good latch but need more help.  You have a pus-like fluid coming from your breast.  Your baby is not gaining weight.  Your baby loses weight. Summary  Latching is how your baby's mouth attaches to your nipple to breastfeed.  Try different positions for breastfeeding to find one that works best for you and your baby.  A poor  latch may cause you to have cracked or sore nipples or other problems.  Work with a breastfeeding specialist (lactation consultant) to help your baby have a good latch. This information is not intended to replace advice given to you by your health care provider. Make sure you discuss any questions you have with your health care provider. Document Revised: 08/26/2019 Document Reviewed: 08/26/2019 Elsevier Patient Education  2021 [employer] Breastfeeding  Choosing to breastfeed is one of the best decisions you can make for yourself and your baby. A change in hormones during pregnancy causes your breasts to make breast milk in your milk-producing glands. Hormones prevent breast milk from being released before your baby is born. They also prompt milk flow after birth. Once breastfeeding has begun, thoughts of your baby, as well as his or her sucking or crying, can stimulate the release of milk from your milk-producing glands. Benefits of breastfeeding Research shows that breastfeeding offers many health benefits for infants and mothers. It also offers a cost-free and convenient way to feed your baby. For your baby  Your first milk (colostrum) helps your baby's digestive system to function better.  Special cells in your milk (antibodies) help your baby to fight off infections.  Breastfed babies are less likely to develop asthma, allergies, obesity, or type 2 diabetes. They are also at lower risk for sudden infant death syndrome (SIDS).  Nutrients in breast milk are better able   to meet your baby's needs compared to infant formula.  Breast milk improves your baby's brain development. For you  Breastfeeding helps to create a very special bond between you and your baby.  Breastfeeding is convenient. Breast milk costs nothing and is always available at the correct temperature.  Breastfeeding helps to burn calories. It helps you to lose the weight that you gained during  pregnancy.  Breastfeeding makes your uterus return faster to its size before pregnancy. It also slows bleeding (lochia) after you give birth.  Breastfeeding helps to lower your risk of developing type 2 diabetes, osteoporosis, rheumatoid arthritis, cardiovascular disease, and breast, ovarian, uterine, and endometrial cancer later in life. Breastfeeding basics Starting breastfeeding  Find a comfortable place to sit or lie down, with your neck and back well-supported.  Place a pillow or a rolled-up blanket under your baby to bring him or her to the level of your breast (if you are seated). Nursing pillows are specially designed to help support your arms and your baby while you breastfeed.  Make sure that your baby's tummy (abdomen) is facing your abdomen.  Gently massage your breast. With your fingertips, massage from the outer edges of your breast inward toward the nipple. This encourages milk flow. If your milk flows slowly, you may need to continue this action during the feeding.  Support your breast with 4 fingers underneath and your thumb above your nipple (make the letter "C" with your hand). Make sure your fingers are well away from your nipple and your baby's mouth.  Stroke your baby's lips gently with your finger or nipple.  When your baby's mouth is open wide enough, quickly bring your baby to your breast, placing your entire nipple and as much of the areola as possible into your baby's mouth. The areola is the colored area around your nipple. ? More areola should be visible above your baby's upper lip than below the lower lip. ? Your baby's lips should be opened and extended outward (flanged) to ensure an adequate, comfortable latch. ? Your baby's tongue should be between his or her lower gum and your breast.  Make sure that your baby's mouth is correctly positioned around your nipple (latched). Your baby's lips should create a seal on your breast and be turned out (everted).  It  is common for your baby to suck about 2-3 minutes in order to start the flow of breast milk. Latching Teaching your baby how to latch onto your breast properly is very important. An improper latch can cause nipple pain, decreased milk supply, and poor weight gain in your baby. Also, if your baby is not latched onto your nipple properly, he or she may swallow some air during feeding. This can make your baby fussy. Burping your baby when you switch breasts during the feeding can help to get rid of the air. However, teaching your baby to latch on properly is still the best way to prevent fussiness from swallowing air while breastfeeding. Signs that your baby has successfully latched onto your nipple  Silent tugging or silent sucking, without causing you pain. Infant's lips should be extended outward (flanged).  Swallowing heard between every 3-4 sucks once your milk has started to flow (after your let-down milk reflex occurs).  Muscle movement above and in front of his or her ears while sucking. Signs that your baby has not successfully latched onto your nipple  Sucking sounds or smacking sounds from your baby while breastfeeding.  Nipple pain. If you think   your baby has not latched on correctly, slip your finger into the corner of your baby's mouth to break the suction and place it between your baby's gums. Attempt to start breastfeeding again. Signs of successful breastfeeding Signs from your baby  Your baby will gradually decrease the number of sucks or will completely stop sucking.  Your baby will fall asleep.  Your baby's body will relax.  Your baby will retain a small amount of milk in his or her mouth.  Your baby will let go of your breast by himself or herself. Signs from you  Breasts that have increased in firmness, weight, and size 1-3 hours after feeding.  Breasts that are softer immediately after breastfeeding.  Increased milk volume, as well as a change in milk consistency  and color by the fifth day of breastfeeding.  Nipples that are not sore, cracked, or bleeding. Signs that your baby is getting enough milk  Wetting at least 1-2 diapers during the first 24 hours after birth.  Wetting at least 5-6 diapers every 24 hours for the first week after birth. The urine should be clear or pale yellow by the age of 5 days.  Wetting 6-8 diapers every 24 hours as your baby continues to grow and develop.  At least 3 stools in a 24-hour period by the age of 5 days. The stool should be soft and yellow.  At least 3 stools in a 24-hour period by the age of 7 days. The stool should be seedy and yellow.  No loss of weight greater than 10% of birth weight during the first 3 days of life.  Average weight gain of 4-7 oz (113-198 g) per week after the age of 4 days.  Consistent daily weight gain by the age of 5 days, without weight loss after the age of 2 weeks. After a feeding, your baby may spit up a small amount of milk. This is normal. Breastfeeding frequency and duration Frequent feeding will help you make more milk and can prevent sore nipples and extremely full breasts (breast engorgement). Breastfeed when you feel the need to reduce the fullness of your breasts or when your baby shows signs of hunger. This is called "breastfeeding on demand." Signs that your baby is hungry include:  Increased alertness, activity, or restlessness.  Movement of the head from side to side.  Opening of the mouth when the corner of the mouth or cheek is stroked (rooting).  Increased sucking sounds, smacking lips, cooing, sighing, or squeaking.  Hand-to-mouth movements and sucking on fingers or hands.  Fussing or crying. Avoid introducing a pacifier to your baby in the first 4-6 weeks after your baby is born. After this time, you may choose to use a pacifier. Research has shown that pacifier use during the first year of a baby's life decreases the risk of sudden infant death syndrome  (SIDS). Allow your baby to feed on each breast as long as he or she wants. When your baby unlatches or falls asleep while feeding from the first breast, offer the second breast. Because newborns are often sleepy in the first few weeks of life, you may need to awaken your baby to get him or her to feed. Breastfeeding times will vary from baby to baby. However, the following rules can serve as a guide to help you make sure that your baby is properly fed:  Newborns (babies 4 weeks of age or younger) may breastfeed every 1-3 hours.  Newborns should not go without breastfeeding   for longer than 3 hours during the day or 5 hours during the night.  You should breastfeed your baby a minimum of 8 times in a 24-hour period. Breast milk pumping Pumping and storing breast milk allows you to make sure that your baby is exclusively fed your breast milk, even at times when you are unable to breastfeed. This is especially important if you go back to work while you are still breastfeeding, or if you are not able to be present during feedings. Your lactation consultant can help you find a method of pumping that works best for you and give you guidelines about how long it is safe to store breast milk.      Caring for your breasts while you breastfeed Nipples can become dry, cracked, and sore while breastfeeding. The following recommendations can help keep your breasts moisturized and healthy:  Avoid using soap on your nipples.  Wear a supportive bra designed especially for nursing. Avoid wearing underwire-style bras or extremely tight bras (sports bras).  Air-dry your nipples for 3-4 minutes after each feeding.  Use only cotton bra pads to absorb leaked breast milk. Leaking of breast milk between feedings is normal.  Use lanolin on your nipples after breastfeeding. Lanolin helps to maintain your skin's normal moisture barrier. Pure lanolin is not harmful (not toxic) to your baby. You may also hand express a few  drops of breast milk and gently massage that milk into your nipples and allow the milk to air-dry. In the first few weeks after giving birth, some women experience breast engorgement. Engorgement can make your breasts feel heavy, warm, and tender to the touch. Engorgement peaks within 3-5 days after you give birth. The following recommendations can help to ease engorgement:  Completely empty your breasts while breastfeeding or pumping. You may want to start by applying warm, moist heat (in the shower or with warm, water-soaked hand towels) just before feeding or pumping. This increases circulation and helps the milk flow. If your baby does not completely empty your breasts while breastfeeding, pump any extra milk after he or she is finished.  Apply ice packs to your breasts immediately after breastfeeding or pumping, unless this is too uncomfortable for you. To do this: ? Put ice in a plastic bag. ? Place a towel between your skin and the bag. ? Leave the ice on for 20 minutes, 2-3 times a day.  Make sure that your baby is latched on and positioned properly while breastfeeding. If engorgement persists after 48 hours of following these recommendations, contact your health care provider or a lactation consultant. Overall health care recommendations while breastfeeding  Eat 3 healthy meals and 3 snacks every day. Well-nourished mothers who are breastfeeding need an additional 450-500 calories a day. You can meet this requirement by increasing the amount of a balanced diet that you eat.  Drink enough water to keep your urine pale yellow or clear.  Rest often, relax, and continue to take your prenatal vitamins to prevent fatigue, stress, and low vitamin and mineral levels in your body (nutrient deficiencies).  Do not use any products that contain nicotine or tobacco, such as cigarettes and e-cigarettes. Your baby may be harmed by chemicals from cigarettes that pass into breast milk and exposure to  secondhand smoke. If you need help quitting, ask your health care provider.  Avoid alcohol.  Do not use illegal drugs or marijuana.  Talk with your health care provider before taking any medicines. These include over-the-counter and prescription   medicines as well as vitamins and herbal supplements. Some medicines that may be harmful to your baby can pass through breast milk.  It is possible to become pregnant while breastfeeding. If birth control is desired, ask your health care provider about options that will be safe while breastfeeding your baby. Where to find more information: [employer]: www.llli.org Contact a health care provider if:  You feel like you want to stop breastfeeding or have become frustrated with breastfeeding.  Your nipples are cracked or bleeding.  Your breasts are red, tender, or warm.  You have: ? Painful breasts or nipples. ? A swollen area on either breast. ? A fever or chills. ? Nausea or vomiting. ? Drainage other than breast milk from your nipples.  Your breasts do not become full before feedings by the fifth day after you give birth.  You feel sad and depressed.  Your baby is: ? Too sleepy to eat well. ? Having trouble sleeping. ? More than 1 week old and wetting fewer than 6 diapers in a 24-hour period. ? Not gaining weight by 5 days of age.  Your baby has fewer than 3 stools in a 24-hour period.  Your baby's skin or the white parts of his or her eyes become yellow. Get help right away if:  Your baby is overly tired (lethargic) and does not want to wake up and feed.  Your baby develops an unexplained fever. Summary  Breastfeeding offers many health benefits for infant and mothers.  Try to breastfeed your infant when he or she shows early signs of hunger.  Gently tickle or stroke your baby's lips with your finger or nipple to allow the baby to open his or her mouth. Bring the baby to your breast. Make sure that much of  the areola is in your baby's mouth. Offer one side and burp the baby before you offer the other side.  Talk with your health care provider or lactation consultant if you have questions or you face problems as you breastfeed. This information is not intended to replace advice given to you by your health care provider. Make sure you discuss any questions you have with your health care provider. Document Revised: 05/23/2017 Document Reviewed: 03/30/2016 Elsevier Patient Education  2021 Elsevier Inc. Breast Pumping Tips Breast pumping is a way to get milk out of your breasts. You will then store the milk for your baby to use when you are away from home. There are three ways to pump.  You can use your hand to massage and squeeze your breast (hand expression).  You can use a hand-held machine to manually pump your milk.  You can use an electric machine to pump your milk. In the beginning you may not get much milk. After a few days, your breasts should make more. Pumping can help you start making milk after your baby is born. Pumping helps you to keep making milk when you are away from your baby. When should I pump? You can start pumping soon after your baby is born. Follow these tips:  When you are with your baby: ? Pump after you breastfeed. ? Pump from the free breast while you breastfeed.  When you are away from your baby: ? Pump every 2-3 hours for 15 minutes. ? Pump both breasts at the same time if you can.  If your baby drinks formula, pump around the time your baby gets the formula.  If you drank alcohol, wait 2 hours before you   pump.  If you are going to have surgery, ask your doctor when you should pump again. How do I get ready to pump? Try to relax. Try these things to help your milk come in:  Smell your baby's blanket or clothes.  Look at a picture or video of your baby.  Sit in a quiet, private space.  Place a cloth on your breast. The cloth should be warm and a little  wet.  Massage your breast and nipple.  Play relaxing music.  Picture your milk flowing.  Drink water and eat a snack. What are some tips? General tips for pumping breast milk  Always wash your hands with soap and water for at least 20 seconds before pumping.  If you do not get much milk or if pumping hurts, try different pump settings or a different kind of pump.  Drink enough fluid so your pee (urine) is clear or pale yellow.  Wear clothing that opens in the front or is easy to take off.  Pump milk into a clean bottle or container.  Do not smoke or use any products that contain nicotine or tobacco. If you need help quitting, ask your doctor.  Try to get a hands-free pumping bra, if possible. This makes it easy to pump breast milk. You can buy one or make your own.   Tips for storing breast milk  Store breast milk in a clean, BPA-free container. These include: ? A glass or plastic bottle. ? A milk storage bag.  Store only 2-4 ounces of breast milk in each container.  Swirl the breast milk in the container. Do not shake it.  Write down the date you pumped the milk on the container.  This is how long you can store breast milk: ? Room temperature: 6-8 hours. It is best to use the milk within 4 hours. ? Cooler with ice packs: 24 hours. ? Refrigerator: 5-8 days, if the milk is clean. It is best to use the milk within 3 days. ? Freezer: 9-12 months, if the milk is clean and stored away from the freezer door. It is best to use the milk within 6 months.  Put milk in the back of the refrigerator or freezer.  Thaw frozen milk using warm water. Do not use the microwave.   Tips for choosing a breast pump When choosing a pump, keep the following things in mind:  Manual breast pumps do not need electricity. They cost less. They can be hard to use.  Electric breast pumps use electricity. They are more expensive. They are easier to use. They collect more milk.  The suction cup  (flange) should be the right size.  Before you buy the pump, check if your insurance will pay for it. Tips for caring for a breast pump  Check the manual that came with your pump for cleaning tips.  Try not to touch the inside of pump parts.  Clean the pump after you use it. To do this: ? Wipe down the electrical part. Use a dry cloth or paper towel. Do not put this part in water or in cleaning products. ? Wash the plastic parts with soap and warm water. Or use the dishwasher if the manual says it is safe. You do not need to clean the tubing unless it touched breast milk. ? Let all the parts air dry. Avoid drying them with a cloth or towel. ? When the parts are clean and dry, put the pump back together. Then   store the pump.  If there is water in the tubing when you want to pump: 1. Attach the tubing to the pump. 2. Turn on the pump to dry the tubing. 3. Turn off the pump when the tube is dry. Summary  Pumping can help you start making milk after your baby is born. It lets you keep making milk when you are away from your baby.  When you are away from your baby, pump for about 15 minutes every 2-3 hours. Pump both breasts at the same time, if you can. This information is not intended to replace advice given to you by your health care provider. Make sure you discuss any questions you have with your health care provider. Document Revised: 12/08/2019 Document Reviewed: 12/08/2019 Elsevier Patient Education  2021 Elsevier Inc.  

## 2020-05-02 NOTE — Anesthesia Postprocedure Evaluation (Signed)
Anesthesia Post Note  Patient: Mikayla Morales  Procedure(s) Performed: AN AD HOC LABOR EPIDURAL  Patient location during evaluation: Mother Baby Anesthesia Type: Epidural Level of consciousness: awake and alert Pain management: pain level controlled Vital Signs Assessment: post-procedure vital signs reviewed and stable Respiratory status: spontaneous breathing, nonlabored ventilation and respiratory function stable Cardiovascular status: stable Postop Assessment: no headache, no backache and epidural receding Anesthetic complications: no   No complications documented.   Last Vitals:  Vitals:   05/01/20 1614 05/01/20 2315  BP: 98/65 106/63  Pulse: 80 83  Resp: 20 18  Temp: 37.1 C 36.7 C  SpO2: 99% 100%    Last Pain:  Vitals:   05/01/20 2315  TempSrc: Oral  PainSc:                  Jeanine Luz

## 2020-05-03 ENCOUNTER — Telehealth: Payer: Self-pay

## 2020-05-03 NOTE — Telephone Encounter (Signed)
Transition Care Management Unsuccessful Follow-up Telephone Call  Date of discharge and from where:  05/02/2020 from [facility].  Attempts:  1st Attempt  Reason for unsuccessful TCM follow-up call:  Voice mail full

## 2020-05-04 NOTE — Telephone Encounter (Signed)
Transition Care Management Follow-up Telephone Call  Date of discharge and from where: 05/02/2020 from [facility]  How have you been since you were released from the hospital? Pt stated that he and baby are doing well. Pt has no questions or concerns at this time.   Any questions or concerns? No  Items Reviewed:  Did the pt receive and understand the discharge instructions provided? Yes   Medications obtained and verified? Yes   Other? No   Any new allergies since your discharge? No   Dietary orders reviewed? n/a  Do you have support at home? Yes    Functional Questionnaire: (I = Independent and D = Dependent) ADLs: I  Bathing/Dressing- I  Meal Prep- I  Eating- I  Maintaining continence- I  Transferring/Ambulation- I  Managing Meds- I  Follow up appointments reviewed:   Specialist Hospital f/u appt confirmed? No  Pt encouraged to call for a follow up appointment.   Are transportation arrangements needed? No   If their condition worsens, is the pt aware to call PCP or go to the Emergency Dept.? Yes  Was the patient provided with contact information for the PCP's office or ED? Yes  Was to pt encouraged to call back with questions or concerns? Yes

## 2020-05-10 NOTE — Telephone Encounter (Signed)
error 

## 2020-05-16 ENCOUNTER — Encounter: Payer: Medicaid Other | Admitting: Certified Nurse Midwife

## 2020-06-07 ENCOUNTER — Encounter: Payer: Self-pay | Admitting: Certified Nurse Midwife

## 2020-06-17 ENCOUNTER — Ambulatory Visit (INDEPENDENT_AMBULATORY_CARE_PROVIDER_SITE_OTHER): Payer: Medicaid Other | Admitting: Certified Nurse Midwife

## 2020-06-17 ENCOUNTER — Encounter: Payer: Self-pay | Admitting: Certified Nurse Midwife

## 2020-06-17 ENCOUNTER — Other Ambulatory Visit: Payer: Self-pay

## 2020-06-17 DIAGNOSIS — Z1331 Encounter for screening for depression: Secondary | ICD-10-CM

## 2020-06-17 NOTE — Progress Notes (Signed)
Subjective:    Mikayla Morales is a 30 y.o. [id number] female who presents for a postpartum visit. She is 6 weeks postpartum following a spontaneous vaginal delivery at 41 gestational weeks. Anesthesia: epidural. I have fully reviewed the prenatal and intrapartum course.  Postpartum course has been uncomplicated except for painful spot to the right of navel.   Baby's course has been uncomplicated. Baby is feeding by breast.   Bleeding no bleeding. Bowel function is normal. Bladder function is normal.   Patient is sexually active. Contraception method is NFP/LAM.   Postpartum depression screening: negative. Score 5.    Last pap 10/2019 and was negative.  Denies difficulty breathing or respiratory distress, chest pain, dysuria, and leg pain or swelling.   The following portions of the patient's history were reviewed and updated as appropriate: allergies, current medications, past medical history, past surgical history and problem list.  Review of Systems  Pertinent items are noted in HPI.   Objective:   BP 130/80   Pulse 72   Wt 135 lb 11.2 oz (61.6 kg)   Breastfeeding Yes   BMI 24.82 kg/m   General:  alert, cooperative and no distress   Breasts:  deferred, no complaints  Lungs: clear to auscultation bilaterally  Heart:  regular rate and rhythm  Abdomen: soft, nontender   Pelvic exam:  declined by patient     Depression screen [facility] 2/9 06/17/2020  Decreased Interest 3  Down, Depressed, Hopeless 0  PHQ - 2 Score 3  Altered sleeping 1  Tired, decreased energy 1  Change in appetite 0  Feeling bad or failure about yourself  0  Trouble concentrating 0  Moving slowly or fidgety/restless 0  Suicidal thoughts 0  PHQ-9 Score 5  Difficult doing work/chores Not difficult at all   GAD 7 : Generalized Anxiety Score 06/17/2020  Nervous, Anxious, on Edge 1  Control/stop worrying 0  Worry too much - different things 0  Trouble relaxing 1  Restless 0  Easily annoyed or irritable 0   Afraid - awful might happen 0  Total GAD 7 Score 2  Anxiety Difficulty Not difficult at all       Assessment:   Postpartum exam Six (6) wks s/p vaginal birth Breastfeeding Depression screening Contraception counseling   Plan:   Encouraged routine health maintenance techniques.   Reviewed red flag symptoms and when to call.   Follow up in: 5 months for ANNUAL EXAM or earlier if needed.   Serafina Royals, CNM [facility], [facility] 06/17/20 11:29 AM

## 2020-06-17 NOTE — Patient Instructions (Signed)
Preventive Care 21-28 Years Old, Female Preventive care refers to lifestyle choices and visits with your health care provider that can promote health and wellness. This includes:  A yearly physical exam. This is also called an annual wellness visit.  Regular dental and eye exams.  Immunizations.  Screening for certain conditions.  Healthy lifestyle choices, such as: ? Eating a healthy diet. ? Getting regular exercise. ? Not using drugs or products that contain nicotine and tobacco. ? Limiting alcohol use. What can I expect for my preventive care visit? Physical exam Your health care provider may check your:  Height and weight. These may be used to calculate your BMI (body mass index). BMI is a measurement that tells if you are at a healthy weight.  Heart rate and blood pressure.  Body temperature.  Skin for abnormal spots. Counseling Your health care provider may ask you questions about your:  Past medical problems.  Family's medical history.  Alcohol, tobacco, and drug use.  Emotional well-being.  Home life and relationship well-being.  Sexual activity.  Diet, exercise, and sleep habits.  Work and work environment.  Access to firearms.  Method of birth control.  Menstrual cycle.  Pregnancy history. What immunizations do I need? Vaccines are usually given at various ages, according to a schedule. Your health care provider will recommend vaccines for you based on your age, medical history, and lifestyle or other factors, such as travel or where you work.   What tests do I need? Blood tests  Lipid and cholesterol levels. These may be checked every 5 years starting at age 20.  Hepatitis C test.  Hepatitis B test. Screening  Diabetes screening. This is done by checking your blood sugar (glucose) after you have not eaten for a while (fasting).  STD (sexually transmitted disease) testing, if you are at risk.  BRCA-related cancer screening. This may be  done if you have a family history of breast, ovarian, tubal, or peritoneal cancers.  Pelvic exam and Pap test. This may be done every 3 years starting at age 21. Starting at age 30, this may be done every 5 years if you have a Pap test in combination with an HPV test. Talk with your health care provider about your test results, treatment options, and if necessary, the need for more tests.   Follow these instructions at home: Eating and drinking  Eat a healthy diet that includes fresh fruits and vegetables, whole grains, lean protein, and low-fat dairy products.  Take vitamin and mineral supplements as recommended by your health care provider.  Do not drink alcohol if: ? Your health care provider tells you not to drink. ? You are pregnant, may be pregnant, or are planning to become pregnant.  If you drink alcohol: ? Limit how much you have to 0-1 drink a day. ? Be aware of how much alcohol is in your drink. In the [place] one drink equals one 12 oz bottle of beer (355 mL), one 5 oz glass of wine (148 mL), or one 1 oz glass of hard liquor (44 mL).   Lifestyle  Take daily care of your teeth and gums. Brush your teeth every morning and night with fluoride toothpaste. Floss one time each day.  Stay active. Exercise for at least 30 minutes 5 or more days each week.  Do not use any products that contain nicotine or tobacco, such as cigarettes, e-cigarettes, and chewing tobacco. If you need help quitting, ask your health care provider.  Do not   use drugs.  If you are sexually active, practice safe sex. Use a condom or other form of protection to prevent STIs (sexually transmitted infections).  If you do not wish to become pregnant, use a form of birth control. If you plan to become pregnant, see your health care provider for a prepregnancy visit.  Find healthy ways to cope with stress, such as: ? Meditation, yoga, or listening to music. ? Journaling. ? Talking to a trusted  person. ? Spending time with friends and family. Safety  Always wear your seat belt while driving or riding in a vehicle.  Do not drive: ? If you have been drinking alcohol. Do not ride with someone who has been drinking. ? When you are tired or distracted. ? While texting.  Wear a helmet and other protective equipment during sports activities.  If you have firearms in your house, make sure you follow all gun safety procedures.  Seek help if you have been physically or sexually abused. What's next?  Go to your health care provider once a year for an annual wellness visit.  Ask your health care provider how often you should have your eyes and teeth checked.  Stay up to date on all vaccines. This information is not intended to replace advice given to you by your health care provider. Make sure you discuss any questions you have with your health care provider. Document Revised: 10/25/2019 Document Reviewed: 11/07/2017 Elsevier Patient Education  2021 Elsevier Inc.  

## 2020-09-30 ENCOUNTER — Emergency Department (HOSPITAL_COMMUNITY)
Admission: EM | Admit: 2020-09-30 | Discharge: 2020-09-30 | Disposition: A | Discharge status: Home / Self Care | Payer: Medicaid Other | Attending: Emergency Medicine | Admitting: Emergency Medicine

## 2020-09-30 ENCOUNTER — Encounter (HOSPITAL_COMMUNITY): Payer: Self-pay | Admitting: Emergency Medicine

## 2020-09-30 DIAGNOSIS — Z5321 Procedure and treatment not carried out due to patient leaving prior to being seen by health care provider: Secondary | ICD-10-CM | POA: Diagnosis not present

## 2020-09-30 DIAGNOSIS — R110 Nausea: Secondary | ICD-10-CM | POA: Diagnosis not present

## 2020-09-30 DIAGNOSIS — H53149 Visual discomfort, unspecified: Secondary | ICD-10-CM | POA: Insufficient documentation

## 2020-09-30 DIAGNOSIS — R519 Headache, unspecified: Secondary | ICD-10-CM | POA: Diagnosis not present

## 2020-09-30 LAB — BASIC METABOLIC PANEL
Anion gap: 8 (ref 5–15)
BUN: 7 mg/dL (ref 6–20)
CO2: 23 mmol/L (ref 22–32)
Calcium: 9.3 mg/dL (ref 8.9–10.3)
Chloride: 107 mmol/L (ref 98–111)
Creatinine, Ser: 0.66 mg/dL (ref 0.44–1.00)
GFR, Estimated: >60 mL/min (ref >60)
Glucose, Bld: 88 mg/dL (ref 70–99)
Potassium: 3.9 mmol/L (ref 3.5–5.1)
Sodium: 138 mmol/L (ref 135–145)

## 2020-09-30 LAB — CBC
HCT: 42 % (ref 36.0–46.0)
Hemoglobin: 13.6 g/dL (ref 12.0–15.0)
MCH: 29.4 pg (ref 26.0–34.0)
MCHC: 32.4 g/dL (ref 30.0–36.0)
MCV: 90.7 fL (ref 80.0–100.0)
Platelets: 272 10*3/uL (ref 150–400)
RBC: 4.63 MIL/uL (ref 3.87–5.11)
RDW: 13.2 % (ref 11.5–15.5)
WBC: 6.9 10*3/uL (ref 4.0–10.5)
nRBC: 0 % (ref 0.0–0.2)

## 2020-09-30 LAB — I-STAT BETA HCG BLOOD, ED (MC, WL, AP ONLY): I-stat hCG, quantitative: <5 m[IU]/mL (ref <5)

## 2020-09-30 NOTE — ED Triage Notes (Signed)
Pt endorses head pain and pressure for a week. Endorses light sensitivity and nausea. States she has migraines in the past but feels different this time.

## 2020-09-30 NOTE — ED Provider Notes (Signed)
Emergency Medicine Provider Triage Evaluation Note  Mikayla Morales , a 30 y.o. female  was evaluated in triage.  Pt complains of headache x 1 week. Gradual onset. Right parietal region, feels like pressure, somewhat different from prior headaches. Has light sensitivity & nausea.   Review of Systems  Positive: Headache Negative: Numbness, weakness, visual disturbance, vomiting.   Physical Exam  BP (!) 124/91   Pulse 85   Temp 98.3 F (36.8 C) (Oral)   Resp 14   Ht 5\' 2"  (1.575 m)   Wt 61.2 kg   SpO2 100%   BMI 24.69 kg/m  Gen:   Awake, no distress   Resp:  Normal effort  MSK:   Moves extremities without difficulty  Other:  Clear speech. CN III-XII grossly intact. Sensation & strength grossly intact x 4.   Medical Decision Making  Medically screening exam initiated at 11:49 AM.  Appropriate orders placed.  Mikayla Morales was informed that the remainder of the evaluation will be completed by another provider, this initial triage assessment does not replace that evaluation, and the importance of remaining in the ED until their evaluation is complete.  Headache   Annett Gula, PA-C 09/30/20 1150    10/02/20, MD 10/04/20 2256

## 2020-10-05 ENCOUNTER — Other Ambulatory Visit: Payer: Self-pay

## 2020-10-05 ENCOUNTER — Emergency Department: Payer: Medicaid Other

## 2020-10-05 ENCOUNTER — Emergency Department
Admission: EM | Admit: 2020-10-05 | Discharge: 2020-10-05 | Disposition: A | Discharge status: Home / Self Care | Payer: Medicaid Other | Attending: Student in an Organized Health Care Education/Training Program | Admitting: Student in an Organized Health Care Education/Training Program

## 2020-10-05 DIAGNOSIS — R519 Headache, unspecified: Secondary | ICD-10-CM | POA: Diagnosis not present

## 2020-10-05 DIAGNOSIS — R42 Dizziness and giddiness: Secondary | ICD-10-CM | POA: Insufficient documentation

## 2020-10-05 DIAGNOSIS — H53149 Visual discomfort, unspecified: Secondary | ICD-10-CM | POA: Diagnosis not present

## 2020-10-05 LAB — POC URINE PREG, ED: Preg Test, Ur: NEGATIVE

## 2020-10-05 MED ORDER — BUTALBITAL-APAP-CAFFEINE 50-325-40 MG PO TABS: 1.0000 | ORAL_TABLET | Freq: Four times a day (QID) | ORAL | 0 refills | Status: AC | PRN

## 2020-10-05 NOTE — ED Triage Notes (Signed)
Pt comes pov with top of head pain for about a week and a half. She checked in but couldn't stay for the wait time.

## 2020-10-05 NOTE — ED Provider Notes (Signed)
[facility] Emergency Department Provider Note  ____________________________________________   Event Date/Time   First MD Initiated Contact with Patient 10/05/20 1013     (approximate)  I have reviewed the triage vital signs and the nursing notes.   HISTORY  Chief Complaint Headache   HPI Mikayla Morales is a 30 y.o. female is to the ED with complaint of headache, dizziness, photophobia for 1-1/2 weeks.  Patient also describes some vertigo at times.  She reports that with exercise or bending over to pick up her children the pain increases.  She is also had some difficulty reading her book this morning.  She denies any fever, chills, nausea or vomiting.  No prior history of hypertension or diagnosis of migraine headaches.  Patient went to [facility] on 09/30/2020 but left prior to being seen.  She had lab work done at that time.  She rates her pain as 6 out of 10.         Past Medical History:  Diagnosis Date   Anxiety    Nausea & vomiting     Patient Active Problem List   Diagnosis Date Noted   History of miscarriage 12/29/2015   Amenorrhea 11/29/2015    Past Surgical History:  Procedure Laterality Date   broke nose      Prior to Admission medications   Medication Sig Start Date End Date Taking? Authorizing Provider  butalbital-acetaminophen-caffeine (FIORICET) 50-325-40 MG tablet Take 1 tablet by mouth every 6 (six) hours as needed for headache. 10/05/20 10/05/21 Yes Levada Schilling, Ceclia Koker L, PA-C  albuterol (VENTOLIN HFA) 108 (90 Base) MCG/ACT inhaler Inhale 2 puffs into the lungs every 6 (six) hours as needed for wheezing or shortness of breath. Patient not taking: Reported on 06/17/2020 03/26/20   Gunnar Bulla, CNM  Prenatal Vit-Fe Fumarate-FA (MULTIVITAMIN-PRENATAL) 27-0.8 MG TABS tablet Take 1 tablet by mouth daily at 12 noon.    [provider]    Allergies Patient has no known allergies.  Family History  Problem  Relation Age of Onset   Heart disease Father    Breast cancer Maternal Grandmother    Ovarian cancer Neg Hx    Colon cancer Neg Hx    Diabetes Neg Hx     Social History Social History   Tobacco Use   Smoking status: Never   Smokeless tobacco: Never  Vaping Use   Vaping Use: Never used  Substance Use Topics   Alcohol use: Not Currently   Drug use: Not Currently    Review of Systems Constitutional: No fever/chills.  Positive dizziness/vertigo occasionally. Eyes: Recent blurred vision, difficulty reading.  Photophobia. ENT: No sore throat. Cardiovascular: Denies chest pain. Respiratory: Denies shortness of breath. Gastrointestinal: No abdominal pain.  No nausea, no vomiting.  No diarrhea.   Genitourinary: Negative for dysuria. Musculoskeletal: Negative for musculoskeletal pain. Skin: Negative for rash. Neurological: Positive for headache.  Negative for focal weakness or numbness. ____________________________________________   PHYSICAL EXAM:  VITAL SIGNS: ED Triage Vitals  Enc Vitals Group     BP 10/05/20 0950 123/90     Pulse Rate 10/05/20 0950 74     Resp 10/05/20 0950 18     Temp 10/05/20 0950 98.4 F (36.9 C)     Temp Source 10/05/20 0950 Oral     SpO2 10/05/20 0950 100 %     Weight 10/05/20 0948 135 lb (61.2 kg)     Height 10/05/20 0948 5\' 2"  (1.575 m)     Head Circumference --  Peak Flow --      Pain Score 10/05/20 0948 6     Pain Loc --      Pain Edu? --      Excl. in GC? --     Constitutional: Alert and oriented. Well appearing and in no acute distress. Eyes: Conjunctivae are normal. PERRL. EOMI. no nystagmus noted. Head: Atraumatic.  No point tenderness on palpation of the scalp. Nose: No congestion/rhinnorhea. Ears: EACs and TMs are clear bilaterally. Neck: No stridor.  Nontender cervical spine to palpation posteriorly.  Range of motion x4 planes without difficulty. Cardiovascular: Normal rate, regular rhythm. Grossly normal heart sounds.  Good  peripheral circulation. Respiratory: Normal respiratory effort.  No retractions. Lungs CTAB. Gastrointestinal: Soft and nontender. No distention.  Musculoskeletal: Moves upper and lower extremities that any difficulty.  Normal gait was noted. Neurologic:  Normal speech and language.  Annual nerves II through XII grossly intact.  No gross focal neurologic deficits are appreciated.  Skin:  Skin is warm, dry and intact. No rash noted. Psychiatric: Mood and affect are normal. Speech and behavior are normal.  ____________________________________________   LABS (all labs ordered are listed, but only abnormal results are displayed)  Labs Reviewed  POC URINE PREG, ED   ____________________________________________   RADIOLOGY Beaulah Corin, personally viewed and evaluated these images (plain radiographs) as part of my medical decision making, as well as reviewing the written report by the radiologist.    Official radiology report(s): CT Head Wo Contrast  Result Date: 10/05/2020 CLINICAL DATA:  Right posterior headache, pressure. Dizziness. Photophobia. EXAM: CT HEAD WITHOUT CONTRAST TECHNIQUE: Contiguous axial images were obtained from the base of the skull through the vertex without intravenous contrast. COMPARISON:  None. FINDINGS: Brain: No evidence of acute infarction, hemorrhage, hydrocephalus, extra-axial collection or mass lesion/mass effect. Vascular: No hyperdense vessel or unexpected calcification. Skull: No acute calvarial abnormality. Sinuses/Orbits: No acute findings Other: None IMPRESSION: Normal study. Electronically Signed   By: Charlett Nose M.D.   On: 10/05/2020 11:17    ____________________________________________   PROCEDURES  Procedure(s) performed (including Critical Care):  Procedures   ____________________________________________   INITIAL IMPRESSION / ASSESSMENT AND PLAN / ED COURSE  As part of my medical decision making, I reviewed the following data  within the electronic MEDICAL RECORD NUMBER Notes from prior ED visits and [place] Controlled Substance Database  30 year old female presents to the ED with complaint of headache intermittently for the last week and a half.  Lab work from her previous visit at [facility] was reviewed and was reassuring.  Patient was made aware that her labs are normal.  CT scan was negative for any acute changes.  Patient will discontinue taking over-the-counter medication.  A trial of Fioricet 1 every 6 hours as needed for headache was sent to her pharmacy.  She is to follow-up with her primary care provider if any continued problems with headaches for further evaluation.   ____________________________________________   FINAL CLINICAL IMPRESSION(S) / ED DIAGNOSES  Final diagnoses:  Acute nonintractable headache, unspecified headache type     ED Discharge Orders          Ordered    butalbital-acetaminophen-caffeine (FIORICET) 50-325-40 MG tablet  Every 6 hours PRN        10/05/20 1204             Note:  This document was prepared using Dragon voice recognition software and may include unintentional dictation errors.    Tommi Rumps, PA-C 10/05/20  0347    Willy Eddy, MD 10/05/20 1530

## 2020-10-06 ENCOUNTER — Telehealth: Payer: Self-pay

## 2020-10-06 NOTE — Telephone Encounter (Signed)
Transition Care Management Follow-up Telephone Call Date of discharge and from where: 10/05/2020-[facility] How have you been since you were released from the hospital? Patient stated she is doing ok.  Any questions or concerns? No  Items Reviewed: Did the pt receive and understand the discharge instructions provided? Yes  Medications obtained and verified? Yes  Other? No  Any new allergies since your discharge? No  Dietary orders reviewed? N/A Do you have support at home? Yes   Home Care and Equipment/Supplies: Were home health services ordered? not applicable If so, what is the name of the agency? N/A  Has the agency set up a time to come to the patient's home? not applicable Were any new equipment or medical supplies ordered?  No What is the name of the medical supply agency? N/A Were you able to get the supplies/equipment? not applicable Do you have any questions related to the use of the equipment or supplies? No  Functional Questionnaire: (I = Independent and D = Dependent) ADLs: I  Bathing/Dressing- I  Meal Prep- I  Eating- I  Maintaining continence- I  Transferring/Ambulation- I  Managing Meds- I  Follow up appointments reviewed:  PCP Hospital f/u appt confirmed? No   Specialist Hospital f/u appt confirmed? No   Are transportation arrangements needed? No  If their condition worsens, is the pt aware to call PCP or go to the Emergency Dept.? Yes Was the patient provided with contact information for the PCP's office or ED? Yes Was to pt encouraged to call back with questions or concerns? Yes

## 2020-11-17 ENCOUNTER — Encounter: Payer: Medicaid Other | Admitting: Certified Nurse Midwife

## 2021-01-06 ENCOUNTER — Ambulatory Visit
Admission: RE | Admit: 2021-01-06 | Discharge: 2021-01-06 | Disposition: A | Discharge status: Home / Self Care | Payer: Medicaid Other | Source: Ambulatory Visit | Attending: Emergency Medicine | Admitting: Emergency Medicine

## 2021-01-06 ENCOUNTER — Other Ambulatory Visit: Payer: Self-pay

## 2021-01-06 VITALS — BP 128/61 | HR 89 | Temp 98.9°F | Resp 20

## 2021-01-06 DIAGNOSIS — J029 Acute pharyngitis, unspecified: Secondary | ICD-10-CM | POA: Diagnosis not present

## 2021-01-06 DIAGNOSIS — H6503 Acute serous otitis media, bilateral: Secondary | ICD-10-CM

## 2021-01-06 LAB — POCT RAPID STREP A (OFFICE): Rapid Strep A Screen: NEGATIVE

## 2021-01-06 MED ORDER — DIPHENHYDRAMINE HCL 12.5 MG/5ML PO LIQD
ORAL | 0 refills | Status: AC
Start: 2021-01-06 — End: ?

## 2021-01-06 NOTE — ED Triage Notes (Signed)
Pt here with persistent sore throat after a URI x 1 week. Throat is red and tonsils are inflamed.

## 2021-01-06 NOTE — ED Provider Notes (Signed)
CHIEF COMPLAINT:   Chief Complaint  Patient presents with   Sore Throat     SUBJECTIVE/HPI:   Sore Throat  A very pleasant 30 y.o.Female presents today with sore throat after a URI for about 1 week.  Patient reports that her throat is red and tonsils are inflamed.  Patient also reports that her ears have felt full.  Patient does not report any shortness of breath, chest pain, palpitations, visual changes, weakness, tingling, headache, nausea, vomiting, diarrhea, fever, chills.   has a past medical history of Anxiety and Nausea & vomiting.  ROS:  Review of Systems See Subjective/HPI Medications, Allergies and Problem List personally reviewed in Epic today OBJECTIVE:   Vitals:   01/06/21 1138  BP: 128/61  Pulse: 89  Resp: 20  Temp: 98.9 F (37.2 C)  SpO2: 100%    Physical Exam   General: Appears well-developed and well-nourished. No acute distress.  HEENT Head: Normocephalic and atraumatic.   Ears: Hearing grossly intact, no drainage or visible deformity.  Nose: No nasal deviation.   Mouth/Throat: No stridor or tracheal deviation.  Mildly erythematous posterior pharynx noted with clear drainage present.  No white patchy exudate noted. Eyes: Conjunctivae and EOM are normal. No eye drainage or scleral icterus bilaterally.  Neck: Normal range of motion, neck is supple.  Cardiovascular: Normal rate. Regular rhythm; no murmurs, gallops, or rubs.  Pulm/Chest: No respiratory distress. Breath sounds normal bilaterally without wheezes, rhonchi, or rales.  Neurological: Alert and oriented to person, place, and time.  Skin: Skin is warm and dry.  No rashes, lesions, abrasions or bruising noted to skin.   Psychiatric: Normal mood, affect, behavior, and thought content.   Vital signs and nursing note reviewed.   Patient stable and cooperative with examination. PROCEDURES:    LABS/X-RAYS/EKG/MEDS:   Results for orders placed or performed during the hospital encounter of  01/06/21  POCT rapid strep A  Result Value Ref Range   Rapid Strep A Screen Negative Negative    MEDICAL DECISION MAKING:   Patient presents with sore throat after a URI for about 1 week.  Patient reports that her throat is red and tonsils are inflamed.  Patient also reports that her ears have felt full.  Patient does not report any shortness of breath, chest pain, palpitations, visual changes, weakness, tingling, headache, nausea, vomiting, diarrhea, fever, chills.  Strep negative.  Likely viral pharyngitis along with bilateral serous otitis media.  Rx Magic mouthwash to the patient's preferred pharmacy and advised about home treatment and care to include Flonase to help with ear fluid buildup.  Return with any fever, chills, vomiting or white patches to throat.  Patient verbalized understanding and agreed with treatment plan.  Patient stable upon discharge. ASSESSMENT/PLAN:  1. Viral pharyngitis - magic mouthwash (lidocaine, diphenhydrAMINE, alum & mag hydroxide) suspension; 40 mL diphenhydramine 12.5 mg per 5 mL elixir 40 mL female Maalox 40 mL 2% viscous lidocaine  Use as needed for sore throat up to 4 times daily. Gargle then spit.  Dispense: 120 mL; Refill: 0  2. Bilateral acute serous otitis media, recurrence not specified Instructions about new medications and side effects provided.  Plan:   Discharge Instructions      Use flonase to help with ear fluid buildup  Use Magic Mouthwash as prescribed  Return with any fever, chills, vomiting, white patches to throat.         Mikayla Greenhouse, FNP 01/06/21 1205

## 2021-01-06 NOTE — Discharge Instructions (Signed)
Use flonase to help with ear fluid buildup  Use Magic Mouthwash as prescribed  Return with any fever, chills, vomiting, white patches to throat.

## 2021-12-20 ENCOUNTER — Encounter: Payer: Self-pay | Admitting: Certified Nurse Midwife

## 2023-07-06 IMAGING — CT CT HEAD W/O CM
3 series · 16 of 47 positions shown, 19 images · non-contrast
Comparison: None.

CLINICAL DATA: Right posterior headache, pressure. Dizziness.
Photophobia.

EXAM:
CT HEAD WITHOUT CONTRAST
TECHNIQUE: Contiguous axial images were obtained from the base of the skull
through the vertex without intravenous contrast.

[Series 3: coronal soft tissue · coronal · 0.31mm/px · 3 of 64 slices shown]
[im 22/64  brain]
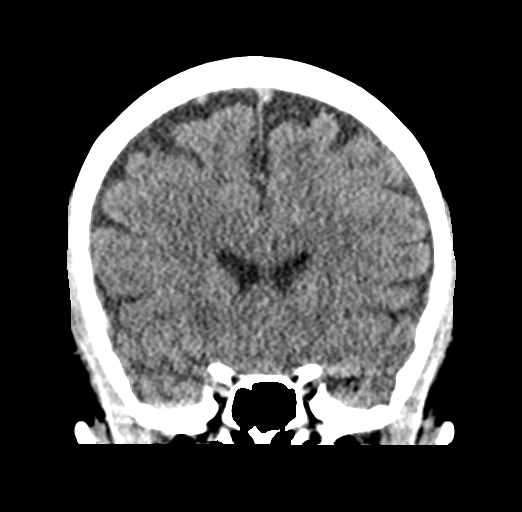
[im 29/64  brain]
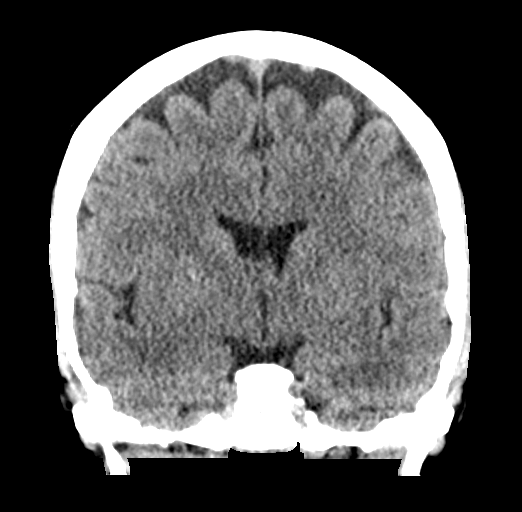
[im 36/64  brain]
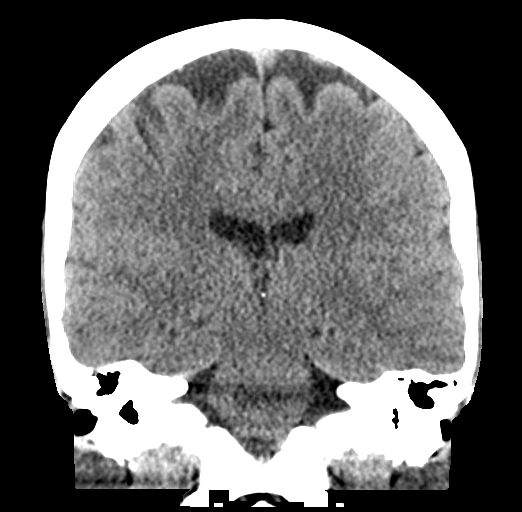

[Series 4: sagittal soft tissue · sagittal · 0.31mm/px · 3 of 55 slices shown]
[im 19/55  brain]
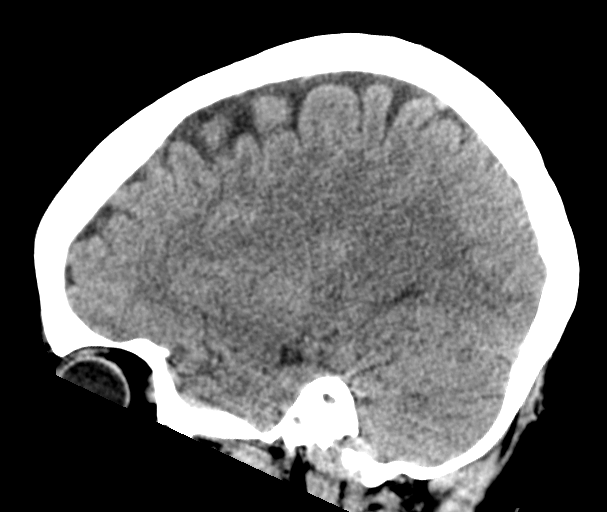
[im 28/55  brain]
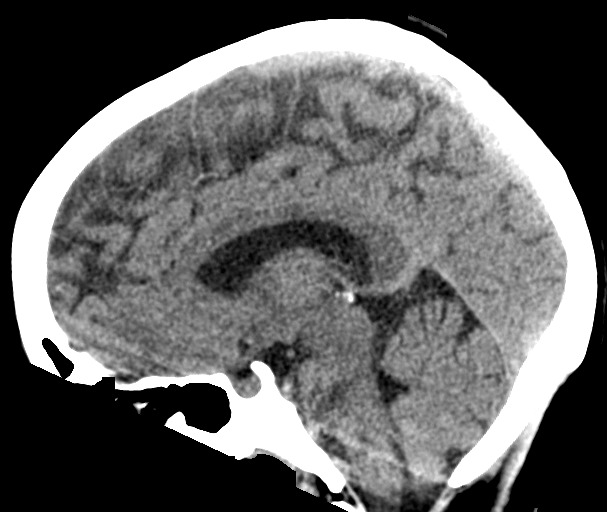
[im 37/55  brain]
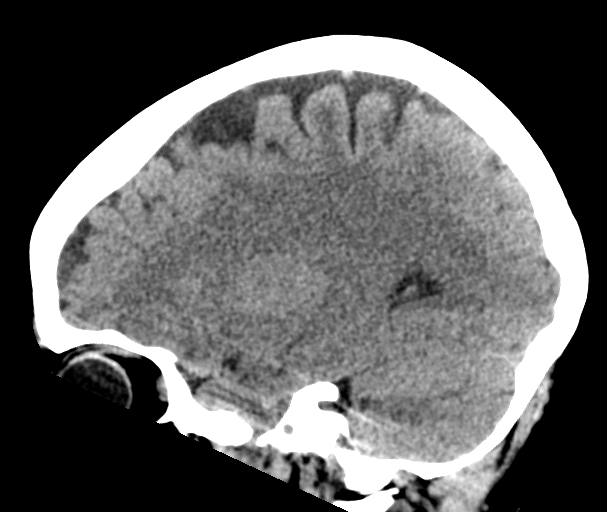

[Series 5: head wo · axial · 0.40mm/px · z∈[-112,+13]mm · 10 of 31 slices shown, 13 images]
[im 3/31  brain]
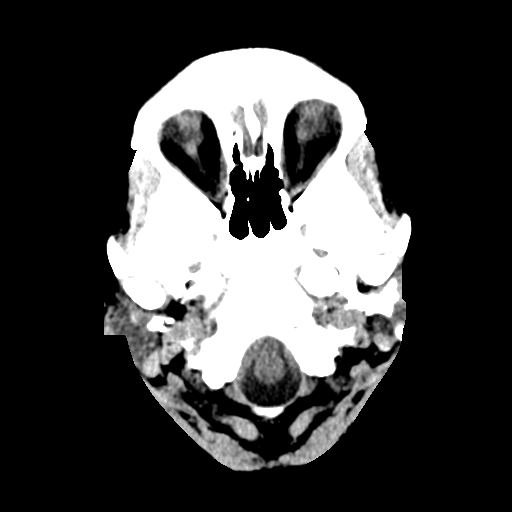
[im 3/31  bone]
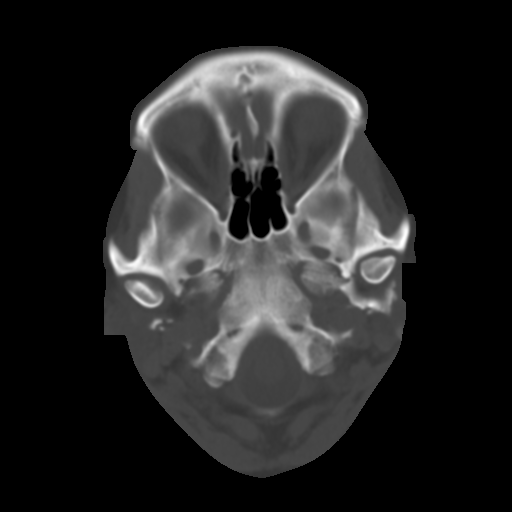
[im 6/31  brain]
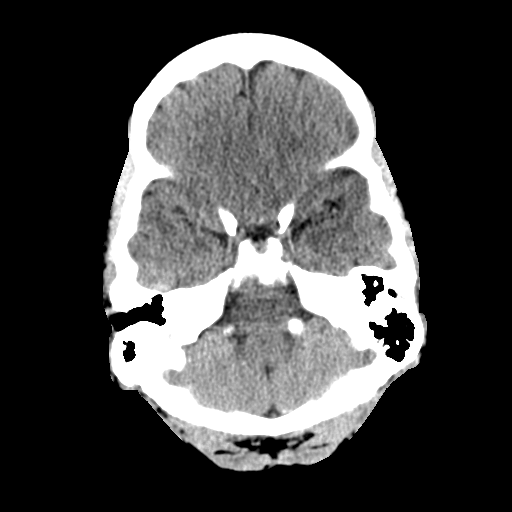
[im 9/31  brain]
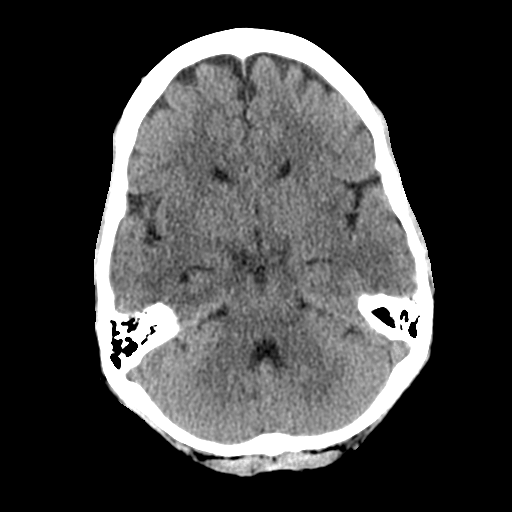
[im 11/31  brain]
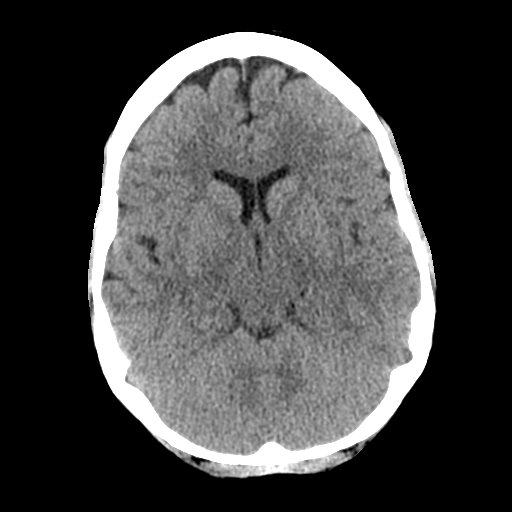
[im 14/31  brain]
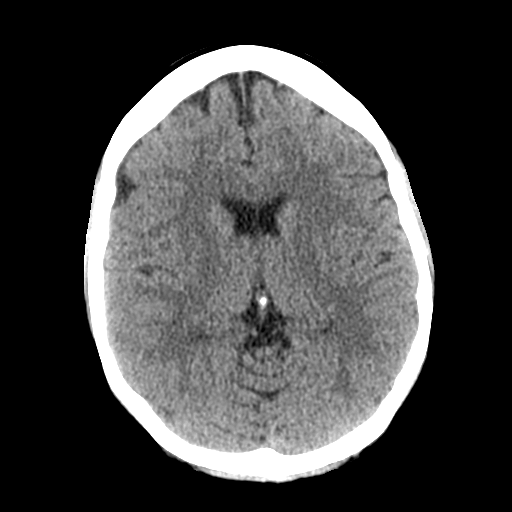
[im 14/31  bone]
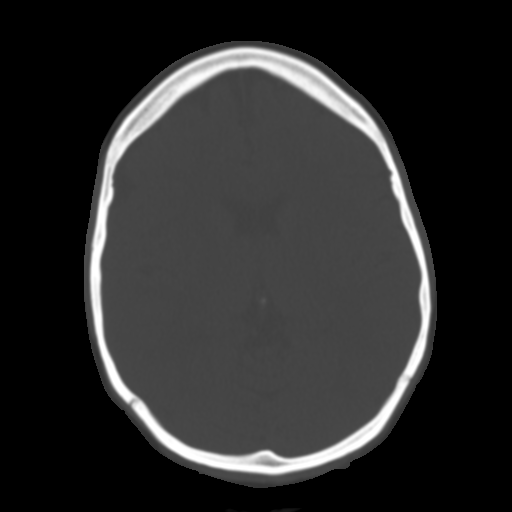
[im 17/31  brain]
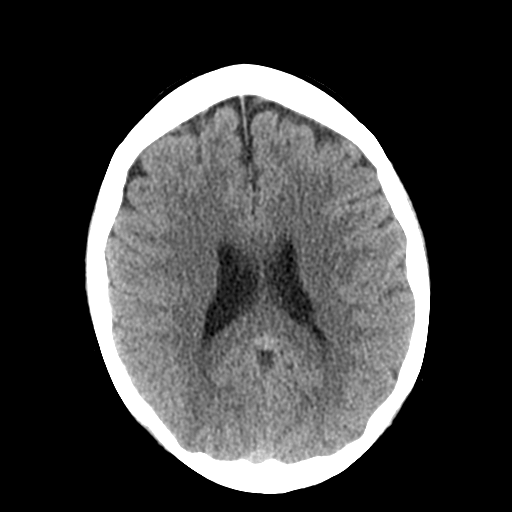
[im 20/31  brain]
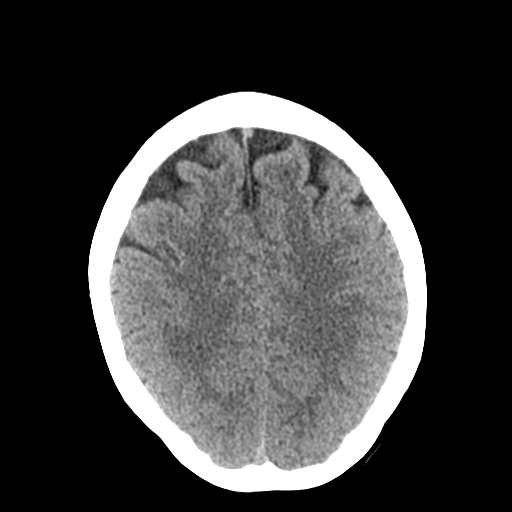
[im 23/31  brain]
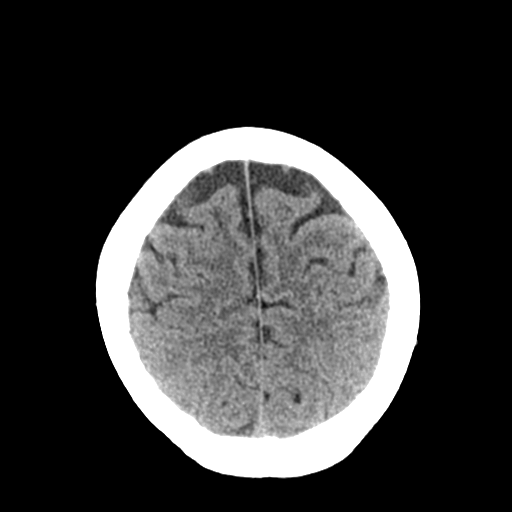
[im 25/31  brain]
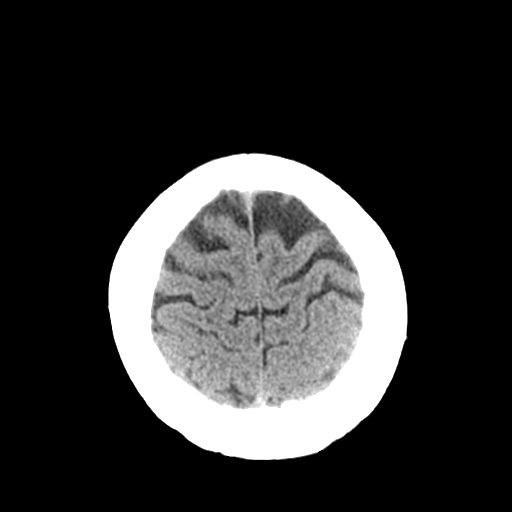
[im 25/31  bone]
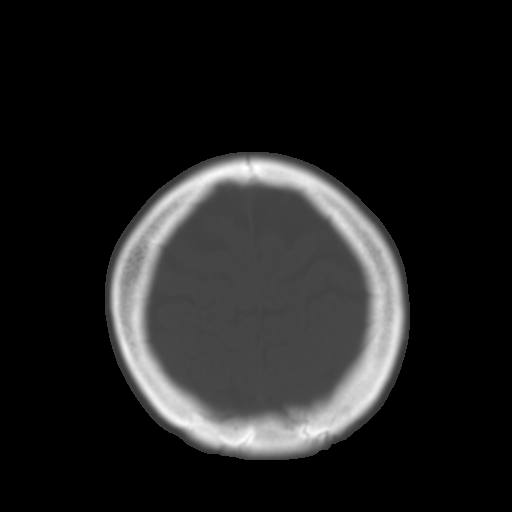
[im 28/31  brain]
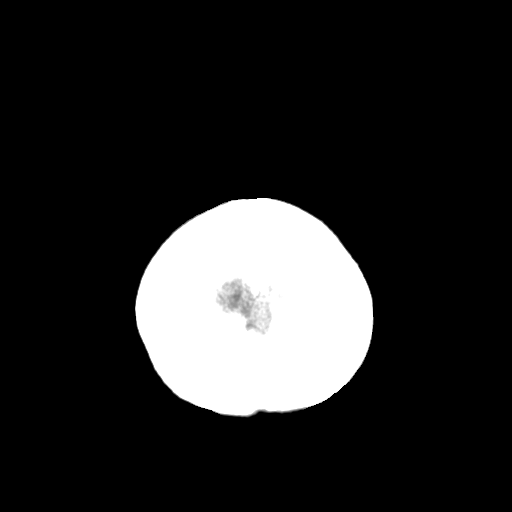

[16 of 47 positions shown; findings below may reference images not displayed]

FINDINGS: Brain: No evidence of acute infarction, hemorrhage, hydrocephalus,
extra-axial collection or mass lesion/mass effect.

Vascular: No hyperdense vessel or unexpected calcification.

Skull: No acute calvarial abnormality.

Sinuses/Orbits: No acute findings

Other: None
IMPRESSION: Normal study.
